# Patient Record
Sex: Female | Born: 1950 | Race: White | Hispanic: No | Marital: Married | Smoking: Former smoker
Health system: Southern US, Community
[De-identification: ages and names within clinical notes are randomized; demographics above are authoritative.]

## PROBLEM LIST (undated history)

## (undated) DIAGNOSIS — M8580 Other specified disorders of bone density and structure, unspecified site: Secondary | ICD-10-CM

## (undated) DIAGNOSIS — E079 Disorder of thyroid, unspecified: Secondary | ICD-10-CM

## (undated) DIAGNOSIS — K317 Polyp of stomach and duodenum: Secondary | ICD-10-CM

## (undated) DIAGNOSIS — K219 Gastro-esophageal reflux disease without esophagitis: Secondary | ICD-10-CM

## (undated) HISTORY — DX: Gastro-esophageal reflux disease without esophagitis: K21.9

## (undated) HISTORY — PX: BREAST CYST ASPIRATION: SHX578

## (undated) HISTORY — DX: Disorder of thyroid, unspecified: E07.9

## (undated) HISTORY — PX: ESOPHAGOGASTRODUODENOSCOPY: SHX1529

## (undated) HISTORY — PX: TUBAL LIGATION: SHX77

## (undated) HISTORY — PX: SHOULDER SURGERY: SHX246

## (undated) HISTORY — DX: Other specified disorders of bone density and structure, unspecified site: M85.80

## (undated) HISTORY — PX: BREAST SURGERY: SHX581

## (undated) HISTORY — DX: Polyp of stomach and duodenum: K31.7

## (undated) HISTORY — PX: BREAST EXCISIONAL BIOPSY: SUR124

---

## 1998-04-01 ENCOUNTER — Other Ambulatory Visit: Admission: RE | Admit: 1998-04-01 | Discharge: 1998-04-01 | Payer: Self-pay | Admitting: Obstetrics and Gynecology

## 1999-03-20 ENCOUNTER — Other Ambulatory Visit: Admission: RE | Admit: 1999-03-20 | Discharge: 1999-03-20 | Payer: Self-pay | Admitting: Obstetrics and Gynecology

## 2000-04-08 ENCOUNTER — Encounter: Admission: RE | Admit: 2000-04-08 | Discharge: 2000-04-08 | Payer: Self-pay | Admitting: Surgery

## 2000-04-08 ENCOUNTER — Encounter: Payer: Self-pay | Admitting: Surgery

## 2000-04-29 ENCOUNTER — Other Ambulatory Visit: Admission: RE | Admit: 2000-04-29 | Discharge: 2000-04-29 | Payer: Self-pay | Admitting: Obstetrics and Gynecology

## 2001-04-10 ENCOUNTER — Encounter: Admission: RE | Admit: 2001-04-10 | Discharge: 2001-04-10 | Payer: Self-pay | Admitting: Surgery

## 2001-04-10 ENCOUNTER — Encounter: Payer: Self-pay | Admitting: Surgery

## 2001-05-16 ENCOUNTER — Other Ambulatory Visit: Admission: RE | Admit: 2001-05-16 | Discharge: 2001-05-16 | Payer: Self-pay | Admitting: Obstetrics and Gynecology

## 2002-04-13 ENCOUNTER — Encounter: Admission: RE | Admit: 2002-04-13 | Discharge: 2002-04-13 | Payer: Self-pay | Admitting: Surgery

## 2002-04-13 ENCOUNTER — Encounter: Payer: Self-pay | Admitting: Surgery

## 2002-05-18 ENCOUNTER — Other Ambulatory Visit: Admission: RE | Admit: 2002-05-18 | Discharge: 2002-05-18 | Payer: Self-pay | Admitting: Obstetrics and Gynecology

## 2003-04-20 ENCOUNTER — Encounter: Payer: Self-pay | Admitting: Surgery

## 2003-04-20 ENCOUNTER — Encounter: Admission: RE | Admit: 2003-04-20 | Discharge: 2003-04-20 | Payer: Self-pay | Admitting: Surgery

## 2003-05-20 ENCOUNTER — Other Ambulatory Visit: Admission: RE | Admit: 2003-05-20 | Discharge: 2003-05-20 | Payer: Self-pay | Admitting: Obstetrics and Gynecology

## 2004-04-20 ENCOUNTER — Encounter: Admission: RE | Admit: 2004-04-20 | Discharge: 2004-04-20 | Payer: Self-pay | Admitting: Obstetrics and Gynecology

## 2004-05-23 ENCOUNTER — Ambulatory Visit (HOSPITAL_COMMUNITY): Admission: RE | Admit: 2004-05-23 | Discharge: 2004-05-23 | Payer: Self-pay | Admitting: Gastroenterology

## 2004-05-29 ENCOUNTER — Other Ambulatory Visit: Admission: RE | Admit: 2004-05-29 | Discharge: 2004-05-29 | Payer: Self-pay | Admitting: Obstetrics and Gynecology

## 2005-04-23 ENCOUNTER — Encounter: Admission: RE | Admit: 2005-04-23 | Discharge: 2005-04-23 | Payer: Self-pay | Admitting: Surgery

## 2005-06-06 ENCOUNTER — Other Ambulatory Visit: Admission: RE | Admit: 2005-06-06 | Discharge: 2005-06-06 | Payer: Self-pay | Admitting: *Deleted

## 2006-04-24 ENCOUNTER — Encounter: Admission: RE | Admit: 2006-04-24 | Discharge: 2006-04-24 | Payer: Self-pay | Admitting: Surgery

## 2006-06-10 ENCOUNTER — Other Ambulatory Visit: Admission: RE | Admit: 2006-06-10 | Discharge: 2006-06-10 | Payer: Self-pay | Admitting: Obstetrics & Gynecology

## 2007-04-28 ENCOUNTER — Encounter: Admission: RE | Admit: 2007-04-28 | Discharge: 2007-04-28 | Payer: Self-pay | Admitting: Surgery

## 2007-05-13 ENCOUNTER — Encounter: Admission: RE | Admit: 2007-05-13 | Discharge: 2007-05-13 | Payer: Self-pay | Admitting: Orthopedic Surgery

## 2007-06-02 ENCOUNTER — Ambulatory Visit (HOSPITAL_BASED_OUTPATIENT_CLINIC_OR_DEPARTMENT_OTHER): Admission: RE | Admit: 2007-06-02 | Discharge: 2007-06-02 | Payer: Self-pay | Admitting: Orthopedic Surgery

## 2007-06-13 ENCOUNTER — Other Ambulatory Visit: Admission: RE | Admit: 2007-06-13 | Discharge: 2007-06-13 | Payer: Self-pay | Admitting: Obstetrics & Gynecology

## 2007-06-27 ENCOUNTER — Encounter: Admission: RE | Admit: 2007-06-27 | Discharge: 2007-06-27 | Payer: Self-pay | Admitting: Obstetrics & Gynecology

## 2007-09-17 ENCOUNTER — Ambulatory Visit (HOSPITAL_COMMUNITY): Admission: RE | Admit: 2007-09-17 | Discharge: 2007-09-17 | Payer: Self-pay | Admitting: Gastroenterology

## 2007-09-17 ENCOUNTER — Encounter (INDEPENDENT_AMBULATORY_CARE_PROVIDER_SITE_OTHER): Payer: Self-pay | Admitting: Gastroenterology

## 2008-04-28 ENCOUNTER — Encounter: Admission: RE | Admit: 2008-04-28 | Discharge: 2008-04-28 | Payer: Self-pay | Admitting: Surgery

## 2008-06-25 ENCOUNTER — Other Ambulatory Visit: Admission: RE | Admit: 2008-06-25 | Discharge: 2008-06-25 | Payer: Self-pay | Admitting: Obstetrics & Gynecology

## 2009-05-02 ENCOUNTER — Encounter: Admission: RE | Admit: 2009-05-02 | Discharge: 2009-05-02 | Payer: Self-pay | Admitting: Surgery

## 2010-05-16 ENCOUNTER — Encounter: Admission: RE | Admit: 2010-05-16 | Discharge: 2010-05-16 | Payer: Self-pay | Admitting: Surgery

## 2011-02-27 NOTE — Op Note (Signed)
Ashlee Turner, Ashlee Turner                ACCOUNT NO.:  [id number]   MEDICAL RECORD NO.:  [id number]          PATIENT TYPE:  AMB   LOCATION:  DSC                          FACILITY:  [facility]   PHYSICIAN:  Robert A. Thurston Hole, M.D. DATE OF BIRTH:  [birth year]   DATE OF PROCEDURE:  06/02/2007  DATE OF DISCHARGE:                               OPERATIVE REPORT   PREOPERATIVE DIAGNOSES:  1. Left shoulder partial rotator cuff tear.  2. Left shoulder partial labrum tear.  3. Left shoulder adhesive capsulitis.  4. Left shoulder impingement.  5. 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   POSTOPERATIVE DIAGNOSES:  1. Left shoulder partial rotator cuff tear.  2. Left shoulder partial labrum tear.  3. Left shoulder adhesive capsulitis.  4. Left shoulder impingement.  5. 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   PROCEDURES:  1. Left shoulder exam under anesthesia followed by manipulation under      anesthesia.  2. Left shoulder arthroscopic debridement, partial rotator cuff tear      and partial labrum tear.  3. Left shoulder lysis of adhesions.  4. Left shoulder subacromial decompression.  5. Left shoulder distal clavicle excision.   SURGEON:  Elana Alm. Thurston Hole, M.D.   ASSISTANT:  Julien Girt, P.A.   ANESTHESIA:  General.   OPERATIVE TIME:  45 minutes.   COMPLICATIONS:  None.   INDICATIONS FOR PROCEDURE:  Ashlee Turner is a 60 year old woman who has  had significant left shoulder pain for over a year with exam and MRI  documenting rotator cuff tendinitis, possible partial tear with  impingement and AC joint DJD, spurring and adhesive capsulitis.  She has  failed conservative care and is now to undergo arthroscopy, manipulation  and debridement.   DESCRIPTION OF PROCEDURE:  Ashlee Turner was brought to the operating room  on June 02, 2007 after an interscalene block was placed in the holding  area by anesthesia.  She was placed on the  operative table in supine  position.  After being placed under general anesthesia her left shoulder  was examined.  Initial range of motion showed forward flexion to 140,  abduction of 140, internal and external rotation of 50 degrees.  A  gentle manipulation was carried out breaking up adhesions and improving  forward flexion to 175, abduction to 175 and internal and external  rotation of 85 degrees.  She was then placed in a beach-chair position  and her shoulder and arm were prepped and draped using sterile  technique.  Originally through a posterior arthroscopic portal the  arthroscope with a pump attachment was placed into an anterior portal  and arthroscopic probe was placed.  On initial inspection the articular  cartilage in the glenohumeral joint showed 25 to 30% grade III  chondromalacia on the humeral head which was debrided.  Glenoid  articular cartilage was intact.  She had partial tearing of the anterior  and anterior and superior labrum 25 to 30% which was debrided.  Biceps  tendon anchor and biceps tendon was intact.  Posterior labrum was  intact.  Anterior and  inferior labrum and anterior and inferior  glenohumeral ligament complex was intact.  Rotator cuff showed a partial  tear 30% of the subscapularis which was debrided.  The rest of the  rotator cuff was intact.  Inferior capsule recess showed moderately  erythematous synovium consistent with adhesive capsulitis which was  partially debrided and cauterized, otherwise this was free of pathology.  The subacromial space was entered and a lateral arthroscopic portal was  made.  A large amount of thickened bursitis was resected.  The rotator  cuff was inflamed and thickened on the bursal surface, but no evidence  of a tear.  Impingement was noted, and a subacromial decompression was  carried out removing 5 mm of the undersurface of the anterior,  anterolateral and anteromedial acromion, and CA ligament release carried  out  as well.  The [facility] joint showed significant spurring and degenerative  changes in the distal 5 to 6 mm of clavicle was resected with a 6 mm  burr.  After this was done the shoulder could be brought through a full  range of motion with no impingement on the rotator cuff.  At this point  it was felt that all pathology had been satisfactorily addressed.  The  instruments were removed.  The portals were closed with 3-0 nylon  suture.  The shoulder both intraarticular portion and subacromial space  was injected with 80 mg of Depo-Medrol and 10 mL of 0.25% Marcaine with  epinephrine.  Sterile dressings were applied.  The patient was then  awakened, extubated and taken to the recovery room in stable condition.   FOLLOWUP CARE:  Ashlee Turner will be followed as an outpatient on  Percocet, Robaxin and Mobic with early aggressive physical therapy.  We  will see her back in the office in a week for sutures out and followup.      Robert A. Thurston Hole, M.D.  Electronically Signed     RAW/MEDQ  D:  06/02/2007  T:  06/02/2007  Job:  [id number]

## 2011-02-27 NOTE — Op Note (Signed)
Ashlee Turner, Ashlee Turner                ACCOUNT NO.:  [id number]   MEDICAL RECORD NO.:  [id number]          PATIENT TYPE:  AMB   LOCATION:  ENDO                         FACILITY:  [facility]   PHYSICIAN:  Anselmo Rod, M.D.  DATE OF BIRTH:  01/07/1951   DATE OF PROCEDURE:  09/17/2007  DATE OF DISCHARGE:  09/17/2007                               OPERATIVE REPORT   PROCEDURE PERFORMED:  Colonoscopy with multiple cold biopsies.   ENDOSCOPIST:  Anselmo Rod.   INSTRUMENT USED:  Pentax video colonoscope.   INDICATIONS FOR PROCEDURE:  A 60 year old white female with a history of  change in bowel habits undergoing colonoscopy.  The patient had a  history of abdominal pain with loose stools; rule out colonic polyps,  masses, etc.   PREPROCEDURE PREPARATION:  Informed consent was procured from the  patient.  The patient fasted for 4 hours prior to the procedure and  prepped with 20 OsmoPrep pills the night of and 12 OsmoPrep pills the  morning of procedure.  Risks and benefits of the procedure including a  10% miss rate of cancer and polyps discussed with the patient as well.   PREPROCEDURE PHYSICAL:  Patient had stable vital signs.  NECK:  Supple.  CHEST:  Clear to auscultation.  S1/S2 regular.  ABDOMEN:  Soft with normal bowel sounds.   DESCRIPTION OF PROCEDURE:  The patient was placed in left lateral  decubitus position and sedated with 100 mcg of Fentanyl and 8 mg of  Versed given intravenously in slow incremental doses.  Once the patient  was adequately sedated and maintained on low-flow oxygen and continuous  cardiac monitoring, the Pentax video colonoscope was advanced from the  rectum to the cecum.  The appendiceal orifice and ileocecal valve were  clearly visualized and photographed.  There was some patchy loss of  vascular margins.  Biopsies were done to rule out collagenous versus  lymphocytic colitis.  No masses, polyps, erosions, ulcerations or  diverticula were seen.   Retroflexion revealed no abnormalities.  The  patient tolerated the procedure well without complications.   IMPRESSION:  1. Normal colonoscopy of the terminal ileum.  No masses, polyps,      erosions, ulcerations or diverticula noted.  2. Patchy loss of vascular margins, biopsies done to rule out      microscopic colitis.   RECOMMENDATIONS:  1. Await pathology results.  2. Avoid all nonsteroidals including aspirin for now.  3. Outpatient follow-up in the next 4 weeks for further      recommendations  4. Repeat colonoscopy in the next 10 years unless the patient has any      abnormal symptoms in the interim in which case she should contact      the office for further recommendations.      Anselmo Rod, M.D.  Electronically Signed     JNM/MEDQ  D:  09/23/2007  T:  09/24/2007  Job:  [id number]   cc:   M. Leda Quail, MD  Fax: [phone]

## 2011-03-02 NOTE — Op Note (Signed)
NAME:  Ashlee Turner, KROGH                          ACCOUNT NO.:  [id number]   MEDICAL RECORD NO.:  [id number]                   PATIENT TYPE:  AMB   LOCATION:  ENDO                                 FACILITY:  [facility]   PHYSICIAN:  James L. Malon Kindle., M.D.          DATE OF BIRTH:  April 03, 1951   DATE OF PROCEDURE:  05/23/2004  DATE OF DISCHARGE:                                 OPERATIVE REPORT   PROCEDURE PERFORMED:  Colonoscopy.   ENDOSCOPIST:  Llana Aliment. Edwards, M.D.   MEDICATIONS:  Fentanyl 100 mcg, Versed 10 mg IV.   INSTRUMENT USED:  Pediatric Olympus adjustable colonoscope.   INDICATIONS FOR PROCEDURE:  Strong family history of colon polyps.   DESCRIPTION OF PROCEDURE:  The procedure had been explained to the patient  and consent obtained.  With the patient in the left lateral decubitus  position, the Olympus colonoscope was inserted and advanced.  The prep was  excellent.  The patient had a long tortuous colon.  Multiple maneuvers  including position changes and abdominal pressure were required and  eventually we were able to reach the cecum.  The ileocecal valve and  appendiceal orifice were seen.  The colon was seen well upon removal.  The  prep was excellent.  The cecum, ascending colon, transverse colon,  descending and sigmoid colon were seen well upon removal and no polyps were  seen.  There was no significant diverticular disease. The scope was  withdrawn.  The patient tolerated the procedure well.   ASSESSMENT:  Family history of polyps with negative colonoscopy at this  time, V198.   PLAN:  Will recommend yearly Hemoccults and repeating a colonoscopy in five  years.                                               James L. Malon Kindle., M.D.    Waldron Session  D:  05/23/2004  T:  05/23/2004  Job:  [id number]   cc:   Marjory Lies, M.D.  [id number]  [place]  [place] [address]  Fax: [phone]

## 2011-04-17 ENCOUNTER — Other Ambulatory Visit (INDEPENDENT_AMBULATORY_CARE_PROVIDER_SITE_OTHER): Payer: Self-pay | Admitting: Surgery

## 2011-04-17 DIAGNOSIS — Z1231 Encounter for screening mammogram for malignant neoplasm of breast: Secondary | ICD-10-CM

## 2011-05-30 ENCOUNTER — Ambulatory Visit
Admission: RE | Admit: 2011-05-30 | Discharge: 2011-05-30 | Disposition: A | Discharge status: Home / Self Care | Payer: BC Managed Care – PPO | Source: Ambulatory Visit | Attending: Surgery | Admitting: Surgery

## 2011-05-30 DIAGNOSIS — Z1231 Encounter for screening mammogram for malignant neoplasm of breast: Secondary | ICD-10-CM

## 2011-07-27 LAB — POCT HEMOGLOBIN-HEMACUE: Hemoglobin: 13.6

## 2012-05-05 ENCOUNTER — Other Ambulatory Visit (INDEPENDENT_AMBULATORY_CARE_PROVIDER_SITE_OTHER): Payer: Self-pay | Admitting: Surgery

## 2012-05-05 DIAGNOSIS — Z1231 Encounter for screening mammogram for malignant neoplasm of breast: Secondary | ICD-10-CM

## 2012-06-02 ENCOUNTER — Inpatient Hospital Stay: Admission: RE | Admit: 2012-06-02 | Payer: BC Managed Care – PPO | Source: Ambulatory Visit

## 2012-06-04 ENCOUNTER — Ambulatory Visit: Payer: BC Managed Care – PPO

## 2012-06-05 ENCOUNTER — Ambulatory Visit
Admission: RE | Admit: 2012-06-05 | Discharge: 2012-06-05 | Disposition: A | Discharge status: Home / Self Care | Payer: BC Managed Care – PPO | Source: Ambulatory Visit | Attending: Surgery | Admitting: Surgery

## 2012-06-05 DIAGNOSIS — Z1231 Encounter for screening mammogram for malignant neoplasm of breast: Secondary | ICD-10-CM

## 2012-07-17 ENCOUNTER — Encounter (INDEPENDENT_AMBULATORY_CARE_PROVIDER_SITE_OTHER): Payer: Self-pay | Admitting: General Surgery

## 2012-07-17 DIAGNOSIS — N6019 Diffuse cystic mastopathy of unspecified breast: Secondary | ICD-10-CM | POA: Insufficient documentation

## 2012-07-18 ENCOUNTER — Encounter (INDEPENDENT_AMBULATORY_CARE_PROVIDER_SITE_OTHER): Payer: Self-pay | Admitting: Surgery

## 2012-07-18 ENCOUNTER — Ambulatory Visit (INDEPENDENT_AMBULATORY_CARE_PROVIDER_SITE_OTHER): Payer: BC Managed Care – PPO | Admitting: Surgery

## 2012-07-18 VITALS — BP 130/72 | HR 76 | Temp 97.2°F | Resp 18 | Ht 61.0 in | Wt 101.0 lb

## 2012-07-18 DIAGNOSIS — N6019 Diffuse cystic mastopathy of unspecified breast: Secondary | ICD-10-CM

## 2012-07-18 NOTE — Progress Notes (Signed)
Chief complaint: Rest followup for fibrocystic disease  History of present illness: This patient follows up annually because of fibrocystic changes in her breasts. She is not in any breast self exams. She had 2 left breast biopsies one in 1984 and one in 1994 both of which were benign showing fibrocystic changes. She has no new symptoms in her breasts.  Past history, family history, review of systems are all noted and not redictated this note  Exam: Vital signs:BP 130/72  Pulse 76  Temp 97.2 F (36.2 C)  Resp 18  Ht 5\' 1"  (1.549 m)  Wt 101 lb (45.813 kg)  BMI 19.08 kg/m2 Gen.: Patient alert oriented healthy-appearing Breasts: The breasts are symmetric. They're not tender. They're smooth and there is no irregularity. They are somewhat dense. No skin nipple or areolar changes are noted. Lymphatics: No axillary or supraclavicular adenopathy on each side  Data reviewed Mammogram: IMPRESSION:  No mammographic evidence of malignancy.  A result letter of this screening mammogram will be mailed directly  to the patient.  RECOMMENDATION:  Screening mammogram in one year. (Code:SM-B-01Y)  BI-RADS CATEGORY 1: Negative. Note this was a tomo mammogram  Impression: Stable exam with mild fiber cystic changes no suspicious areas  Plan: She needs to continue annual mammogram and I recommended continue with tomo-mammograms.she can follow up with her primary physicians or with [place] in a year if she wishes.

## 2012-07-18 NOTE — Patient Instructions (Signed)
Continue annual mammograms  

## 2013-05-14 ENCOUNTER — Other Ambulatory Visit: Payer: Self-pay

## 2013-05-14 DIAGNOSIS — Z1231 Encounter for screening mammogram for malignant neoplasm of breast: Secondary | ICD-10-CM

## 2013-06-11 ENCOUNTER — Ambulatory Visit
Admission: RE | Admit: 2013-06-11 | Discharge: 2013-06-11 | Disposition: A | Discharge status: Home / Self Care | Payer: BC Managed Care – PPO | Source: Ambulatory Visit

## 2013-06-11 DIAGNOSIS — Z1231 Encounter for screening mammogram for malignant neoplasm of breast: Secondary | ICD-10-CM

## 2013-07-07 ENCOUNTER — Encounter: Payer: Self-pay | Admitting: Obstetrics & Gynecology

## 2013-07-24 ENCOUNTER — Ambulatory Visit (INDEPENDENT_AMBULATORY_CARE_PROVIDER_SITE_OTHER): Payer: BC Managed Care – PPO | Admitting: Surgery

## 2013-07-24 ENCOUNTER — Encounter (INDEPENDENT_AMBULATORY_CARE_PROVIDER_SITE_OTHER): Payer: Self-pay | Admitting: Surgery

## 2013-07-24 VITALS — BP 110/72 | HR 68 | Temp 97.9°F | Resp 14 | Ht 61.0 in | Wt 102.4 lb

## 2013-07-24 DIAGNOSIS — N6019 Diffuse cystic mastopathy of unspecified breast: Secondary | ICD-10-CM

## 2013-07-24 NOTE — Progress Notes (Signed)
Chief complaint: Rest followup for fibrocystic disease  History of present illness: This patient follows up annually because of fibrocystic changes in her breasts. She does not do any breast self exams. She had 2 left breast biopsies one in 1984 and one in 1994 both of which were benign showing fibrocystic changes. She has no new symptoms in her breasts.  Past history, family history, review of systems are all noted and not redictated this note  Exam: Vital signs:BP 110/72  Pulse 68  Temp(Src) 97.9 F (36.6 C) (Temporal)  Resp 14  Ht 5\' 1"  (1.549 m)  Wt 102 lb 6.4 oz (46.448 kg)  BMI 19.36 kg/m2 Gen.: Patient alert oriented healthy-appearing Breasts: The breasts are symmetric. They're not tender. They're smooth and there is no irregularity. They are somewhat dense. No skin nipple or areolar changes are noted. Lymphatics: No axillary or supraclavicular adenopathy on each side  Data reviewed Mammogram 06/12/13 *RADIOLOGY REPORT*  Clinical Data: Screening.  DIGITAL SCREENING BILATERAL MAMMOGRAM WITH CAD  DIGITAL BREAST TOMOSYNTHESIS  Digital breast tomosynthesis images are acquired in two  projections. These images are reviewed in combination with the  digital mammogram, confirming the findings below.  Comparison: 04/24/2006  FINDINGS:  ACR Breast Density Category c: The breast tissue is  heterogeneously dense, which may obscure small masses.  There is no suspicious dominant mass, architectural distortion, or  calcification to suggest malignancy.  Images were processed with CAD.  IMPRESSION:  No mammographic evidence of malignancy.  A result letter of this screening mammogram will be mailed directly  to the patient.  RECOMMENDATION:  Screening mammogram in one year. (Code:SM-B-01Y)  BI-RADS CATEGORY 1: Negative.  Original Report Authenticated By: Cain Saupe, M.D.   Impression: Stable exam with mild fiber cystic changes no suspicious areas  Plan: She needs to continue  annual mammogram and I recommended continue with tomo-mammograms.she can follow up with her primary physicians or with [place] in a year if she wishes.

## 2013-07-24 NOTE — Patient Instructions (Signed)
Continue annual followups. See Dr Donell Beers next year since I will be retired

## 2013-09-09 ENCOUNTER — Encounter: Payer: Self-pay | Admitting: Obstetrics & Gynecology

## 2013-09-14 ENCOUNTER — Encounter: Payer: Self-pay | Admitting: Obstetrics & Gynecology

## 2013-09-14 ENCOUNTER — Ambulatory Visit (INDEPENDENT_AMBULATORY_CARE_PROVIDER_SITE_OTHER): Payer: BC Managed Care – PPO | Admitting: Obstetrics & Gynecology

## 2013-09-14 VITALS — BP 140/78 | HR 68 | Resp 16 | Ht 61.25 in | Wt 103.4 lb

## 2013-09-14 DIAGNOSIS — Z124 Encounter for screening for malignant neoplasm of cervix: Secondary | ICD-10-CM

## 2013-09-14 DIAGNOSIS — Z0000 Encounter for general adult medical examination without abnormal findings: Secondary | ICD-10-CM

## 2013-09-14 DIAGNOSIS — Z01419 Encounter for gynecological examination (general) (routine) without abnormal findings: Secondary | ICD-10-CM

## 2013-09-14 LAB — POCT URINALYSIS DIPSTICK
Leukocytes, UA: NEGATIVE
Nitrite, UA: NEGATIVE
Urobilinogen, UA: NEGATIVE

## 2013-09-14 NOTE — Patient Instructions (Signed)

## 2013-09-14 NOTE — Progress Notes (Signed)
62 y.o. G4P3 MarriedCaucasianF here for annual exam.  No vaginal bleeding.  Has a new baby granddaughter.  Family is doing really well.  Had finger injury in garage door while at the beach.  Last blood work was in April.  Tried Paxil last year for three months for anxiety.  Didn't really do anything for her.    Patient's last menstrual period was 10/15/2006.          Sexually active: yes  The current method of family planning is tubal ligation.    Exercising: yes  gym and walking/jogging Smoker:  no  Health Maintenance: Pap:  08/29/11 WNL History of abnormal Pap:  no MMG:  06/11/13 normal Colonoscopy:  2008 repeat in 10 years (Dr. Loreta Ave) BMD:   9/08, declines TDaP:  2014 Screening Labs: PCP, Hb today: PCP, Urine today: negative   reports that she quit smoking about 38 years ago. She has never used smokeless tobacco. She reports that she drinks alcohol. She reports that she does not use illicit drugs.  Past Medical History  Diagnosis Date  . Thyroid disease     hypothyroidism  . GERD (gastroesophageal reflux disease)   . Osteopenia   . Gastric polyp     Past Surgical History  Procedure Laterality Date  . Breast surgery      breast biopsy x2  . Shoulder surgery      left shoulder  . Esophagogastroduodenoscopy    . Tubal ligation      Current Outpatient Prescriptions  Medication Sig Dispense Refill  . cholecalciferol (VITAMIN D) 1000 UNITS tablet Take 1,000 Units by mouth daily.      Marland Kitchen ELDERBERRY PO Take by mouth.      . levothyroxine (SYNTHROID, LEVOTHROID) 75 MCG tablet Take 75 mcg by mouth daily.      Marland Kitchen MAGNESIUM PO Take by mouth.      . NON FORMULARY Natural raw food green supplement in smoothie every morning      . omega-3 acid ethyl esters (LOVAZA) 1 G capsule Take by mouth daily.      Maxwell Caul Bicarbonate (ZEGERID PO) Take 20 mg by mouth daily.      . Probiotic Product (PROBIOTIC DAILY PO) Take by mouth daily.       No current facility-administered  medications for this visit.    Family History  Problem Relation Age of Onset  . Thyroid disease Mother   . Thyroid disease Maternal Grandmother   . Breast cancer Maternal Aunt     ROS:  Pertinent items are noted in HPI.  Otherwise, a comprehensive ROS was negative.  Exam:   BP 140/78  Pulse 68  Resp 16  Ht 5' 1.25" (1.556 m)  Wt 103 lb 6.4 oz (46.902 kg)  BMI 19.37 kg/m2  LMP 10/15/2006  Weight change: _1lb  Height: 5' 1.25" (155.6 cm)  Ht Readings from Last 3 Encounters:  09/14/13 5' 1.25" (1.556 m)  07/24/13 5\' 1"  (1.549 m)  07/18/12 5\' 1"  (1.549 m)    General appearance: alert, cooperative and appears stated age Head: Normocephalic, without obvious abnormality, atraumatic Neck: no adenopathy, supple, symmetrical, trachea midline and thyroid normal to inspection and palpation Lungs: clear to auscultation bilaterally Breasts: normal appearance, no masses or tenderness Heart: regular rate and rhythm Abdomen: soft, non-tender; bowel sounds normal; no masses,  no organomegaly Extremities: extremities normal, atraumatic, no cyanosis or edema Skin: Skin color, texture, turgor normal. No rashes or lesions Lymph nodes: Cervical, supraclavicular, and axillary nodes normal.  No abnormal inguinal nodes palpated Neurologic: Grossly normal   Pelvic: External genitalia:  no lesions              Urethra:  normal appearing urethra with no masses, tenderness or lesions              Bartholins and Skenes: normal                 Vagina: normal appearing vagina with normal color and discharge, no lesions              Cervix: no lesions              Pap taken: yes Bimanual Exam:  Uterus:  normal size, contour, position, consistency, mobility, non-tender              Adnexa: normal adnexa and no mass, fullness, tenderness               Rectovaginal: Confirms               Anus:  normal sphincter tone, no lesions  A:  Well Woman with normal exam PMP no HRT Hypothyroidism H/O IBS  P:    Mammogram yearly pap smear with HR HPV today Labs with Dr. Doristine Counter.  Goes once yearly. return annually or prn  An After Visit Summary was printed and given to the patient.

## 2013-09-18 LAB — IPS PAP TEST WITH HPV

## 2013-12-17 ENCOUNTER — Ambulatory Visit: Payer: Self-pay | Admitting: Obstetrics & Gynecology

## 2014-05-18 ENCOUNTER — Other Ambulatory Visit: Payer: Self-pay

## 2014-05-18 DIAGNOSIS — Z1231 Encounter for screening mammogram for malignant neoplasm of breast: Secondary | ICD-10-CM

## 2014-06-14 ENCOUNTER — Encounter (INDEPENDENT_AMBULATORY_CARE_PROVIDER_SITE_OTHER): Payer: Self-pay

## 2014-06-14 ENCOUNTER — Ambulatory Visit
Admission: RE | Admit: 2014-06-14 | Discharge: 2014-06-14 | Disposition: A | Discharge status: Home / Self Care | Payer: BC Managed Care – PPO | Source: Ambulatory Visit

## 2014-06-14 DIAGNOSIS — Z1231 Encounter for screening mammogram for malignant neoplasm of breast: Secondary | ICD-10-CM

## 2014-08-16 ENCOUNTER — Encounter: Payer: Self-pay | Admitting: Obstetrics & Gynecology

## 2014-10-01 ENCOUNTER — Encounter: Payer: Self-pay | Admitting: Obstetrics & Gynecology

## 2014-10-01 ENCOUNTER — Ambulatory Visit (INDEPENDENT_AMBULATORY_CARE_PROVIDER_SITE_OTHER): Payer: BC Managed Care – PPO | Admitting: Obstetrics & Gynecology

## 2014-10-01 VITALS — BP 132/80 | HR 68 | Resp 16 | Ht 61.5 in | Wt 103.4 lb

## 2014-10-01 DIAGNOSIS — Z01419 Encounter for gynecological examination (general) (routine) without abnormal findings: Secondary | ICD-10-CM

## 2014-10-01 DIAGNOSIS — Z0000 Encounter for general adult medical examination without abnormal findings: Secondary | ICD-10-CM

## 2014-10-01 LAB — POCT URINALYSIS DIPSTICK
Bilirubin, UA: NEGATIVE
Blood, UA: NEGATIVE
GLUCOSE UA: NEGATIVE
Ketones, UA: NEGATIVE
Leukocytes, UA: NEGATIVE
NITRITE UA: NEGATIVE
PROTEIN UA: NEGATIVE
UROBILINOGEN UA: NEGATIVE
pH, UA: 7

## 2014-10-01 NOTE — Progress Notes (Signed)
63 y.o. G4P3 MarriedCaucasianF here for annual exam.  No vaginal bleeding.  Continues to have GI issues.    Saw Dr. Donell BeersByerly in October for breast exam.  Used to see Dr. Jamey RipaStreck until he retired.    PCP:  Dr. Rosezetta SchlatterBurnette.  Saw in April  Patient's last menstrual period was 10/15/2006.          Sexually active: Yes.    The current method of family planning is tubal ligation.    Exercising: Yes.    gym ,trainer, walking, and jogging Smoker:  Former smoker-years ago  Health Maintenance: Pap:  09/14/13 WNL/negative HR HPV History of abnormal Pap:  no MMG:  06/14/14 3D-normal Colonoscopy:  2008-repeat in 10 years Dr Loreta AveMann BMD:   9/08, declines treatment TDaP:  2014 Screening Labs: PCP, Hb today: PCP, Urine today: PH-7.0   reports that she quit smoking about 39 years ago. She has never used smokeless tobacco. She reports that she drinks alcohol. She reports that she does not use illicit drugs.  Past Medical History  Diagnosis Date  . Thyroid disease     hypothyroidism  . GERD (gastroesophageal reflux disease)   . Osteopenia   . Gastric polyp     Past Surgical History  Procedure Laterality Date  . Breast surgery      breast biopsy x2  . Shoulder surgery      left shoulder  . Esophagogastroduodenoscopy    . Tubal ligation      Current Outpatient Prescriptions  Medication Sig Dispense Refill  . cholecalciferol (VITAMIN D) 1000 UNITS tablet Take 1,000 Units by mouth daily.    Marland Kitchen. ELDERBERRY PO Take by mouth. Immune system builder    . levothyroxine (SYNTHROID, LEVOTHROID) 75 MCG tablet Take 75 mcg by mouth daily.    Marland Kitchen. MAGNESIUM PO Take by mouth.    . NON FORMULARY Natural raw food green supplement in smoothie every morning    . Omeprazole-Sodium Bicarbonate (ZEGERID PO) Take 20 mg by mouth as needed.     . Probiotic Product (PROBIOTIC DAILY PO) Take by mouth daily.     No current facility-administered medications for this visit.    Family History  Problem Relation Age of Onset  .  Thyroid disease Mother   . Thyroid disease Maternal Grandmother   . Breast cancer Maternal Aunt     ROS:  Pertinent items are noted in HPI.  Otherwise, a comprehensive ROS was negative.  Exam:   BP 132/80 mmHg  Pulse 68  Resp 16  Ht 5' 1.5" (1.562 m)  Wt 103 lb 6.4 oz (46.902 kg)  BMI 19.22 kg/m2  LMP 10/15/2006     Height: 5' 1.5" (156.2 cm)  Ht Readings from Last 3 Encounters:  10/01/14 5' 1.5" (1.562 m)  09/14/13 5' 1.25" (1.556 m)  07/24/13 5\' 1"  (1.549 m)    General appearance: alert, cooperative and appears stated age Head: Normocephalic, without obvious abnormality, atraumatic Neck: no adenopathy, supple, symmetrical, trachea midline and thyroid normal to inspection and palpation Lungs: clear to auscultation bilaterally Breasts: normal appearance, no masses or tenderness Heart: regular rate and rhythm Abdomen: soft, non-tender; bowel sounds normal; no masses,  no organomegaly Extremities: extremities normal, atraumatic, no cyanosis or edema Skin: Skin color, texture, turgor normal. No rashes or lesions Lymph nodes: Cervical, supraclavicular, and axillary nodes normal. No abnormal inguinal nodes palpated Neurologic: Grossly normal   Pelvic: External genitalia:  no lesions  Urethra:  normal appearing urethra with no masses, tenderness or lesions              Bartholins and Skenes: normal                 Vagina: normal appearing vagina with normal color and discharge, no lesions              Cervix: no lesions              Pap taken: No. Bimanual Exam:  Uterus:  normal size, contour, position, consistency, mobility, non-tender              Adnexa: normal adnexa and no mass, fullness, tenderness               Rectovaginal: Confirms               Anus:  normal sphincter tone, no lesions  Chaperone was present for exam.  A:  Well Woman with normal exam PMP no HRT Hypothyroidism, on Synthroid. H/O IBS Fibrocystic breast disease Remote hx of  hydrosalpinx on ultrasound that resolved with follow up.  P: Mammogram yearly.  Doing 3D yearly.  Seeing Dr. Donell BeersByerly yearly as well.   pap smear with HR HPV 2014.  No pap today.   Labs with Dr. Doristine CounterBurnett. Goes once yearly. return annually or prn

## 2015-05-19 ENCOUNTER — Other Ambulatory Visit: Payer: Self-pay

## 2015-05-19 DIAGNOSIS — Z1231 Encounter for screening mammogram for malignant neoplasm of breast: Secondary | ICD-10-CM

## 2015-06-24 ENCOUNTER — Ambulatory Visit
Admission: RE | Admit: 2015-06-24 | Discharge: 2015-06-24 | Disposition: A | Discharge status: Home / Self Care | Payer: BC Managed Care – PPO | Source: Ambulatory Visit

## 2015-06-24 DIAGNOSIS — Z1231 Encounter for screening mammogram for malignant neoplasm of breast: Secondary | ICD-10-CM

## 2015-10-18 ENCOUNTER — Telehealth: Payer: Self-pay | Admitting: Obstetrics & Gynecology

## 2015-10-18 DIAGNOSIS — Z0000 Encounter for general adult medical examination without abnormal findings: Secondary | ICD-10-CM

## 2015-10-18 NOTE — Telephone Encounter (Signed)
Patient wants to have blood work done when she has her annual in February. Will she need an order? She is asking the nurse to call her to let her know this can be done.

## 2015-10-18 NOTE — Telephone Encounter (Signed)
Spoke with patient. Patient states that she had alb work done with her PCP this year, but did not have her cholesterol checked. Would like to have this checked at her aex with Dr.Miller on 12/01/2015 at 9 am. Advise this can be checked at this appointment. This will need to be done fasting. Needs to be fasting for 9-12 hours prior to appointment. Patient is agreeable. Aware if she would like any additional lab work will need to speak with Dr.Miller about this at her appointment so labs can be added if needed. Future order for lipid panel placed.  Routing to provider for final review. Patient agreeable to disposition. Will close encounter.

## 2015-12-01 ENCOUNTER — Ambulatory Visit (INDEPENDENT_AMBULATORY_CARE_PROVIDER_SITE_OTHER): Payer: BC Managed Care – PPO | Admitting: Obstetrics & Gynecology

## 2015-12-01 ENCOUNTER — Encounter: Payer: Self-pay | Admitting: Obstetrics & Gynecology

## 2015-12-01 VITALS — BP 136/72 | HR 72 | Resp 16 | Ht 61.25 in | Wt 100.0 lb

## 2015-12-01 DIAGNOSIS — Z124 Encounter for screening for malignant neoplasm of cervix: Secondary | ICD-10-CM | POA: Diagnosis not present

## 2015-12-01 DIAGNOSIS — Z0000 Encounter for general adult medical examination without abnormal findings: Secondary | ICD-10-CM | POA: Diagnosis not present

## 2015-12-01 DIAGNOSIS — Z01419 Encounter for gynecological examination (general) (routine) without abnormal findings: Secondary | ICD-10-CM | POA: Diagnosis not present

## 2015-12-01 DIAGNOSIS — Z205 Contact with and (suspected) exposure to viral hepatitis: Secondary | ICD-10-CM | POA: Diagnosis not present

## 2015-12-01 LAB — POCT URINALYSIS DIPSTICK
BILIRUBIN UA: NEGATIVE
Blood, UA: NEGATIVE
GLUCOSE UA: NEGATIVE
KETONES UA: NEGATIVE
LEUKOCYTES UA: NEGATIVE
Nitrite, UA: NEGATIVE
PH UA: 7
Protein, UA: NEGATIVE
Urobilinogen, UA: NEGATIVE

## 2015-12-01 LAB — LIPID PANEL
CHOL/HDL RATIO: 2.9 ratio (ref <=5.0)
Cholesterol: 184 mg/dL (ref 125–200)
HDL: 64 mg/dL (ref >=46)
LDL CALC: 110 mg/dL (ref <130)
TRIGLYCERIDES: 52 mg/dL (ref <150)
VLDL: 10 mg/dL (ref <30)

## 2015-12-01 NOTE — Progress Notes (Signed)
65 y.o. G4P3 MarriedCaucasianF here for annual exam.  Doing well.  Denies vaginal bleeding.  Has not had Hep C testing done.  PCP:  Dr. Doristine Counter  Patient's last menstrual period was 10/15/2006.          Sexually active: No. The current method of family planning is abstinence and post menopausal status.    Exercising: Yes.    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 HR HPV:neg  History of abnormal Pap:  no MMG:  06/24/15 BIRADS1:Neg Colonoscopy:  2008 repeat 10 years  BMD:  07/02/2007 Osteopenia  TDaP:  03/2013  Zostavax:  done Screening Labs: PCP, Hb today: PCP, Urine today: Negative   reports that she quit smoking about 40 years ago. She has never used smokeless tobacco. She reports that she drinks alcohol. She reports that she does not use illicit drugs.  Past Medical History  Diagnosis Date  . Thyroid disease     hypothyroidism  . GERD (gastroesophageal reflux disease)   . Osteopenia   . Gastric polyp     Past Surgical History  Procedure Laterality Date  . Breast surgery      breast biopsy x2  . Shoulder surgery      left shoulder  . Esophagogastroduodenoscopy    . Tubal ligation      Current Outpatient Prescriptions  Medication Sig Dispense Refill  . b complex vitamins capsule Take 1 capsule by mouth daily.    . cholecalciferol (VITAMIN D) 1000 UNITS tablet Take 1,000 Units by mouth daily.    Marland Kitchen ELDERBERRY PO Take by mouth. Immune system builder    . levothyroxine (SYNTHROID, LEVOTHROID) 75 MCG tablet Take 75 mcg by mouth daily.    . NON FORMULARY Natural raw food green supplement in smoothie every morning    . Omeprazole-Sodium Bicarbonate (ZEGERID PO) Take 20 mg by mouth as needed.     . Probiotic Product (PROBIOTIC DAILY PO) Take by mouth daily.     No current facility-administered medications for this visit.    Family History  Problem Relation Age of Onset  . Thyroid disease Mother   . Thyroid disease Maternal  Grandmother   . Breast cancer Maternal Aunt     ROS:  Pertinent items are noted in HPI.  Otherwise, a comprehensive ROS was negative.  Exam:   BP 136/72 mmHg  Pulse 72  Resp 16  Ht 5' 1.25" (1.556 m)  Wt 100 lb (45.36 kg)  BMI 18.74 kg/m2  LMP 10/15/2006  Weight change: +3#  Height: 5' 1.25" (155.6 cm)  Ht Readings from Last 3 Encounters:  12/01/15 5' 1.25" (1.556 m)  10/01/14 5' 1.5" (1.562 m)  09/14/13 5' 1.25" (1.556 m)    General appearance: alert, cooperative and appears stated age Head: Normocephalic, without obvious abnormality, atraumatic Neck: no adenopathy, supple, symmetrical, trachea midline and thyroid normal to inspection and palpation Lungs: clear to auscultation bilaterally Breasts: normal appearance, no masses or tenderness Heart: regular rate and rhythm Abdomen: soft, non-tender; bowel sounds normal; no masses,  no organomegaly Extremities: extremities normal, atraumatic, no cyanosis or edema Skin: Skin color, texture, turgor normal. No rashes or lesions Lymph nodes: Cervical, supraclavicular, and axillary nodes normal. No abnormal inguinal nodes palpated Neurologic: Grossly normal  Pelvic: External genitalia:  no lesions              Urethra:  normal appearing urethra with no masses, tenderness or lesions  Bartholins and Skenes: normal                 Vagina: normal appearing vagina with normal color and discharge, no lesions              Cervix: no lesions              Pap taken: Yes.   Bimanual Exam:  Uterus:  normal size, contour, position, consistency, mobility, non-tender              Adnexa: normal adnexa and no mass, fullness, tenderness               Rectovaginal: Confirms               Anus:  normal sphincter tone, no lesions  Chaperone was present for exam.  A:  Well Woman with normal exam PMP no HRT Hypothyroidism, on Synthroid H/O IBS Fibrocystic breast disease Remote hx of hydrosalpinx on ultrasound that resolved with  follow up.  No further additional ultrasounds done.  P: Mammogram yearly. Doing 3D yearly. Seeing Dr. Donell Beers yearly as well.  pap smear with HR HPV 2014. Pap with HR HPV. Hep C antibody Lipids Labs and vaccines with Dr. Rosezetta Schlatter return annually or prn

## 2015-12-02 LAB — HEPATITIS C ANTIBODY: HCV AB: NEGATIVE

## 2015-12-05 LAB — IPS PAP TEST WITH HPV

## 2016-05-30 ENCOUNTER — Other Ambulatory Visit: Payer: Self-pay | Admitting: General Surgery

## 2016-05-30 DIAGNOSIS — Z1231 Encounter for screening mammogram for malignant neoplasm of breast: Secondary | ICD-10-CM

## 2016-06-25 ENCOUNTER — Ambulatory Visit
Admission: RE | Admit: 2016-06-25 | Discharge: 2016-06-25 | Disposition: A | Discharge status: Home / Self Care | Payer: BC Managed Care – PPO | Source: Ambulatory Visit | Attending: General Surgery | Admitting: General Surgery

## 2016-06-25 DIAGNOSIS — Z1231 Encounter for screening mammogram for malignant neoplasm of breast: Secondary | ICD-10-CM

## 2016-12-06 NOTE — Progress Notes (Signed)
66 y.o. [id number] Married Caucasian F here for annual exam.  Doing well.  No vaginal bleeding.  Really not having GI issues at all at this time.  Careful with diet.  Exercising regularly.  Retired in June.  So happy about this.  PCP:  Dr. Andrey CampanileWilson.  Lab work done in April.    Patient's last menstrual period was 10/15/2006.          Sexually active: Yes.    The current method of family planning is tubal ligation.    Exercising: Yes.    Walking, trainer, strength training Smoker:  Former smoker   Health Maintenance: Pap: 12/01/15 negative, HR HPV negative, 09/14/13 negative, HR HPV negative  History of abnormal Pap:  no MMG:  06/25/16 BIRADS 1 negative  Colonoscopy:  2008- repeat 10 years  BMD:   07/02/07 osteopenia  TDaP:  03/15/13 Pneumonia vaccine(s):  never Zostavax:   08/13/12  Hep C testing: 12/01/15 negative Screening Labs: PCP, Hb today: PCP   reports that she quit smoking about 41 years ago. She has never used smokeless tobacco. She reports that she drinks alcohol. She reports that she does not use drugs.  Past Medical History:  Diagnosis Date  . Gastric polyp   . GERD (gastroesophageal reflux disease)   . Osteopenia   . Thyroid disease    hypothyroidism    Past Surgical History:  Procedure Laterality Date  . BREAST SURGERY     breast biopsy x2  . ESOPHAGOGASTRODUODENOSCOPY    . SHOULDER SURGERY     left shoulder  . TUBAL LIGATION      Current Outpatient Prescriptions  Medication Sig Dispense Refill  . b complex vitamins capsule Take 1 capsule by mouth daily.    . cholecalciferol (VITAMIN D) 1000 UNITS tablet Take 1,000 Units by mouth daily.    Marland Kitchen. ELDERBERRY PO Take by mouth. Immune system builder    . famotidine (PEPCID) 20 MG tablet Take 20 mg by mouth.    . levothyroxine (SYNTHROID, LEVOTHROID) 75 MCG tablet Take 75 mcg by mouth daily.    . NON FORMULARY Natural raw food green supplement in smoothie every morning    . Omeprazole-Sodium Bicarbonate (ZEGERID PO) Take  20 mg by mouth as needed.     . Probiotic Product (PROBIOTIC DAILY PO) Take by mouth daily.     No current facility-administered medications for this visit.     Family History  Problem Relation Age of Onset  . Thyroid disease Mother   . Thyroid disease Maternal Grandmother   . Breast cancer Maternal Aunt     ROS:  Pertinent items are noted in HPI.  Otherwise, a comprehensive ROS was negative.  Exam:   BP 134/74 (BP Location: Right Arm, Patient Position: Sitting, Cuff Size: Normal)   Pulse 72   Resp 14   Ht 5\' 1"  (1.549 m)   Wt 103 lb 12.8 oz (47.1 kg)   LMP 10/15/2006   BMI 19.61 kg/m   Weight change: +3#    Height: 5\' 1"  (154.9 cm)  Ht Readings from Last 3 Encounters:  12/07/16 5\' 1"  (1.549 m)  12/01/15 5' 1.25" (1.556 m)  10/01/14 5' 1.5" (1.562 m)    General appearance: alert, cooperative and appears stated age Head: Normocephalic, without obvious abnormality, atraumatic Neck: no adenopathy, supple, symmetrical, trachea midline and thyroid normal to inspection and palpation Lungs: clear to auscultation bilaterally Breasts: normal appearance, no masses or tenderness Heart: regular rate and rhythm Abdomen: soft, non-tender; bowel sounds  normal; no masses,  no organomegaly Extremities: extremities normal, atraumatic, no cyanosis or edema Skin: Skin color, texture, turgor normal. No rashes or lesions Lymph nodes: Cervical, supraclavicular, and axillary nodes normal. No abnormal inguinal nodes palpated Neurologic: Grossly normal   Pelvic: External genitalia:  no lesions              Urethra:  normal appearing urethra with no masses, tenderness or lesions              Bartholins and Skenes: normal                 Vagina: normal appearing vagina with normal color and discharge, no lesions              Cervix: no lesions              Pap taken: No. Bimanual Exam:  Uterus:  normal size, contour, position, consistency, mobility, non-tender              Adnexa: normal  adnexa and no mass, fullness, tenderness               Rectovaginal: Confirms               Anus:  normal sphincter tone, no lesions  Chaperone was present for exam.  A:  Well Woman with normal exam PMP, no RHT Hypothyroidism H/O IBS Fibrocystic breast disease Remote hx of hydrosalpinx on ultrasound with resolution in follow up  P:   Mammogram guidelines reveiwed. pap smear and HR HPV neg 2017.  Not indicated this year. Lab work with Dr. Andrey Campanile Return annually or prn

## 2016-12-07 ENCOUNTER — Encounter: Payer: Self-pay | Admitting: Obstetrics & Gynecology

## 2016-12-07 ENCOUNTER — Ambulatory Visit (INDEPENDENT_AMBULATORY_CARE_PROVIDER_SITE_OTHER): Payer: BC Managed Care – PPO | Admitting: Obstetrics & Gynecology

## 2016-12-07 VITALS — BP 134/74 | HR 72 | Resp 14 | Ht 61.0 in | Wt 103.8 lb

## 2016-12-07 DIAGNOSIS — Z01419 Encounter for gynecological examination (general) (routine) without abnormal findings: Secondary | ICD-10-CM | POA: Diagnosis not present

## 2017-01-30 DIAGNOSIS — M8589 Other specified disorders of bone density and structure, multiple sites: Secondary | ICD-10-CM | POA: Insufficient documentation

## 2017-03-21 ENCOUNTER — Ambulatory Visit: Payer: BC Managed Care – PPO | Admitting: Obstetrics & Gynecology

## 2017-05-27 ENCOUNTER — Other Ambulatory Visit: Payer: Self-pay | Admitting: Physical Medicine and Rehabilitation

## 2017-05-27 DIAGNOSIS — Z1231 Encounter for screening mammogram for malignant neoplasm of breast: Secondary | ICD-10-CM

## 2017-06-26 ENCOUNTER — Ambulatory Visit: Payer: BC Managed Care – PPO

## 2017-07-02 ENCOUNTER — Ambulatory Visit
Admission: RE | Admit: 2017-07-02 | Discharge: 2017-07-02 | Disposition: A | Discharge status: Home / Self Care | Payer: Medicare Other | Source: Ambulatory Visit | Attending: Physical Medicine and Rehabilitation | Admitting: Physical Medicine and Rehabilitation

## 2017-07-02 DIAGNOSIS — Z1231 Encounter for screening mammogram for malignant neoplasm of breast: Secondary | ICD-10-CM

## 2018-01-20 ENCOUNTER — Encounter: Payer: Self-pay | Admitting: Obstetrics & Gynecology

## 2018-03-14 ENCOUNTER — Encounter: Payer: Self-pay | Admitting: Obstetrics & Gynecology

## 2018-03-14 ENCOUNTER — Other Ambulatory Visit: Payer: Self-pay

## 2018-03-14 ENCOUNTER — Ambulatory Visit (INDEPENDENT_AMBULATORY_CARE_PROVIDER_SITE_OTHER): Payer: Medicare Other | Admitting: Obstetrics & Gynecology

## 2018-03-14 ENCOUNTER — Encounter

## 2018-03-14 VITALS — BP 122/60 | HR 76 | Resp 14 | Ht 61.0 in | Wt 99.0 lb

## 2018-03-14 DIAGNOSIS — E039 Hypothyroidism, unspecified: Secondary | ICD-10-CM | POA: Diagnosis not present

## 2018-03-14 DIAGNOSIS — Z01419 Encounter for gynecological examination (general) (routine) without abnormal findings: Secondary | ICD-10-CM

## 2018-03-14 NOTE — Progress Notes (Signed)
67 y.o. [id number] MarriedCaucasianF here for annual exam.  Doing well.  Denies vaginal bleeding.  [place]  They [place] her husband's home place.    PCP:  Dr. Andrey CampanileWilson  Patient's last menstrual period was 10/15/2006.          Sexually active: No.  The current method of family planning is tubal ligation.    Exercising: Yes.    gym/trainer/ walk Smoker:  Former smoker   Health Maintenance: Pap:  12/01/15 Neg. HR HPV:neg   09/14/13 Neg. HR HPV:neg  History of abnormal Pap:  no MMG:  07/02/17 BIRADS1:Neg  Colonoscopy:  01/20/18 Normal, Dr. Loreta AveMann BMD:  07/02/07 Osteopenia.  Has declined doing this.   TDaP:  2014 Pneumonia vaccine(s):  08-14-17 Shingrix:   08-13-12 Hep C testing: 12/01/15 Neg Screening Labs: PCP does labs    reports that she quit smoking about 42 years ago. She has never used smokeless tobacco. She reports that she drinks alcohol. She reports that she does not use drugs.  Past Medical History:  Diagnosis Date  . Gastric polyp   . GERD (gastroesophageal reflux disease)   . Osteopenia   . Thyroid disease    hypothyroidism    Past Surgical History:  Procedure Laterality Date  . BREAST CYST ASPIRATION Left   . BREAST EXCISIONAL BIOPSY Left    No noticable scar   . BREAST SURGERY     breast biopsy x2  . ESOPHAGOGASTRODUODENOSCOPY    . SHOULDER SURGERY     left shoulder  . TUBAL LIGATION      Current Outpatient Medications  Medication Sig Dispense Refill  . b complex vitamins capsule Take 1 capsule by mouth daily.    . cholecalciferol (VITAMIN D) 1000 UNITS tablet Take 1,000 Units by mouth daily.    Marland Kitchen. ELDERBERRY PO Take by mouth. Immune system builder    . famotidine (PEPCID) 20 MG tablet Take 20 mg by mouth.    . levothyroxine (SYNTHROID, LEVOTHROID) 75 MCG tablet Take 75 mcg by mouth daily.    . NON FORMULARY Natural raw food green supplement in smoothie every morning    . Omeprazole-Sodium Bicarbonate (ZEGERID PO) Take 20 mg by mouth as needed.     .  Probiotic Product (PROBIOTIC DAILY PO) Take by mouth daily.     No current facility-administered medications for this visit.     Family History  Problem Relation Age of Onset  . Thyroid disease Mother   . Thyroid disease Maternal Grandmother   . Breast cancer Maternal Aunt     Review of Systems  All other systems reviewed and are negative.   Exam:   BP 122/60 (BP Location: Right Arm, Patient Position: Sitting, Cuff Size: Normal)   Pulse 76   Resp 14   Ht 5\' 1"  (1.549 m)   Wt 99 lb (44.9 kg)   LMP 10/15/2006   BMI 18.71 kg/m   Height: 5\' 1"  (154.9 cm)  Ht Readings from Last 3 Encounters:  03/14/18 5\' 1"  (1.549 m)  12/07/16 5\' 1"  (1.549 m)  12/01/15 5' 1.25" (1.556 m)    General appearance: alert, cooperative and appears stated age Head: Normocephalic, without obvious abnormality, atraumatic Neck: no adenopathy, supple, symmetrical, trachea midline and thyroid normal to inspection and palpation Lungs: clear to auscultation bilaterally Breasts: normal appearance, no masses or tenderness Heart: regular rate and rhythm Abdomen: soft, non-tender; bowel sounds normal; no masses,  no organomegaly Extremities: extremities normal, atraumatic, no cyanosis or edema Skin: Skin color,  texture, turgor normal. No rashes or lesions Lymph nodes: Cervical, supraclavicular, and axillary nodes normal. No abnormal inguinal nodes palpated Neurologic: Grossly normal   Pelvic: External genitalia:  no lesions              Urethra:  normal appearing urethra with no masses, tenderness or lesions              Bartholins and Skenes: normal                 Vagina: normal appearing vagina with normal color and discharge, no lesions              Cervix: no lesions              Pap taken: No. Bimanual Exam:  Uterus:  normal size, contour, position, consistency, mobility, non-tender              Adnexa: normal adnexa and no mass, fullness, tenderness               Rectovaginal: Confirms                Anus:  normal sphincter tone, no lesions  Chaperone was present for exam.  A:  Well Woman with normal exam PMP, no HRT Hypothyroidism H/o IBS Fibrocystic breast disease Remote hx of hydrosalpinx on ultrasound with resolution on follow up ultrasound  P:   Mammogram guidelines reviewed pap smear with neg HR HPV 2017.  Not indicated today. Lab work is UTD Shingrix vaccination discussed. Return annually or prn

## 2018-05-29 ENCOUNTER — Other Ambulatory Visit: Payer: Self-pay | Admitting: Obstetrics & Gynecology

## 2018-05-29 DIAGNOSIS — Z1231 Encounter for screening mammogram for malignant neoplasm of breast: Secondary | ICD-10-CM

## 2018-07-03 ENCOUNTER — Ambulatory Visit
Admission: RE | Admit: 2018-07-03 | Discharge: 2018-07-03 | Disposition: A | Discharge status: Home / Self Care | Payer: Medicare Other | Source: Ambulatory Visit | Attending: Obstetrics & Gynecology | Admitting: Obstetrics & Gynecology

## 2018-07-03 DIAGNOSIS — Z1231 Encounter for screening mammogram for malignant neoplasm of breast: Secondary | ICD-10-CM

## 2019-05-27 ENCOUNTER — Other Ambulatory Visit: Payer: Self-pay | Admitting: General Surgery

## 2019-05-27 DIAGNOSIS — Z1231 Encounter for screening mammogram for malignant neoplasm of breast: Secondary | ICD-10-CM

## 2019-06-23 ENCOUNTER — Other Ambulatory Visit: Payer: Self-pay

## 2019-06-25 ENCOUNTER — Other Ambulatory Visit: Payer: Self-pay

## 2019-06-25 ENCOUNTER — Ambulatory Visit (INDEPENDENT_AMBULATORY_CARE_PROVIDER_SITE_OTHER): Payer: Medicare Other

## 2019-06-25 ENCOUNTER — Other Ambulatory Visit (HOSPITAL_COMMUNITY)
Admission: RE | Admit: 2019-06-25 | Discharge: 2019-06-25 | Disposition: A | Discharge status: Home / Self Care | Payer: Medicare Other | Source: Ambulatory Visit | Attending: Obstetrics & Gynecology | Admitting: Obstetrics & Gynecology

## 2019-06-25 ENCOUNTER — Ambulatory Visit (INDEPENDENT_AMBULATORY_CARE_PROVIDER_SITE_OTHER): Payer: Medicare Other | Admitting: Obstetrics & Gynecology

## 2019-06-25 ENCOUNTER — Encounter: Payer: Self-pay | Admitting: Obstetrics & Gynecology

## 2019-06-25 VITALS — BP 120/86 | HR 80 | Temp 97.7°F | Ht 61.0 in | Wt 98.8 lb

## 2019-06-25 DIAGNOSIS — E039 Hypothyroidism, unspecified: Secondary | ICD-10-CM | POA: Diagnosis not present

## 2019-06-25 DIAGNOSIS — N9489 Other specified conditions associated with female genital organs and menstrual cycle: Secondary | ICD-10-CM

## 2019-06-25 DIAGNOSIS — Z01419 Encounter for gynecological examination (general) (routine) without abnormal findings: Secondary | ICD-10-CM

## 2019-06-25 DIAGNOSIS — Z124 Encounter for screening for malignant neoplasm of cervix: Secondary | ICD-10-CM | POA: Diagnosis not present

## 2019-06-25 NOTE — Progress Notes (Signed)
67 y.o. [id number] Married White or Caucasian female here for annual exam.  They've moved to [place] and bought her husband's homeplace.  It has several acres around it.    Denies vaginal bleeding.     Had video visit in July with Dr. Redmond Pulling.  He only recommended having thyroid testing today.  Patient's last menstrual period was 10/15/2006.          Sexually active: No.  The current method of family planning is tubal ligation.    Exercising: Yes.    walking, weights Smoker:  no  Health Maintenance: Pap:  12/01/15 Neg. HR HPV:neg   09/14/13 Neg. HR HPV:neg  History of abnormal Pap:  no MMG:  07/03/18 BIRADS1:neg. Has appt 07/13/19 Colonoscopy:  01/20/18 f/u 10 years BMD:   2008 Osteopenia TDaP:  2014 Pneumonia vaccine(s):  2018 Shingrix:   Completed  Hep C testing: 12/01/15 Neg  Screening Labs: TSH   reports that she quit smoking about 43 years ago. She has never used smokeless tobacco. She reports previous alcohol use. She reports that she does not use drugs.  Past Medical History:  Diagnosis Date  . Gastric polyp   . GERD (gastroesophageal reflux disease)   . Osteopenia   . Thyroid disease    hypothyroidism    Past Surgical History:  Procedure Laterality Date  . BREAST CYST ASPIRATION Left   . BREAST EXCISIONAL BIOPSY Left    No noticable scar   . BREAST SURGERY     breast biopsy x2  . ESOPHAGOGASTRODUODENOSCOPY    . SHOULDER SURGERY     left shoulder  . TUBAL LIGATION      Current Outpatient Medications  Medication Sig Dispense Refill  . b complex vitamins capsule Take 1 capsule by mouth daily.    Marland Kitchen ELDERBERRY PO Take by mouth. Immune system builder    . famotidine (PEPCID) 20 MG tablet Take 20 mg by mouth.    . levothyroxine (SYNTHROID, LEVOTHROID) 75 MCG tablet Take 75 mcg by mouth daily.    . NON FORMULARY Natural raw food green supplement in smoothie every morning    . Omeprazole-Sodium Bicarbonate (ZEGERID PO) Take 20 mg by mouth as needed.     . Probiotic  Product (PROBIOTIC DAILY PO) Take by mouth daily.     No current facility-administered medications for this visit.     Family History  Problem Relation Age of Onset  . Thyroid disease Mother   . Thyroid disease Maternal Grandmother   . Breast cancer Maternal Aunt     Review of Systems  All other systems reviewed and are negative.   Exam:   BP 120/86   Pulse 80   Temp 97.7 F (36.5 C) (Temporal)   Ht 5\' 1"  (1.549 m)   Wt 98 lb 12.8 oz (44.8 kg)   LMP 10/15/2006   BMI 18.67 kg/m   Height: 5\' 1"  (154.9 cm)  Ht Readings from Last 3 Encounters:  06/25/19 5\' 1"  (1.549 m)  03/14/18 5\' 1"  (1.549 m)  12/07/16 5\' 1"  (1.549 m)    General appearance: alert, cooperative and appears stated age Head: Normocephalic, without obvious abnormality, atraumatic Neck: no adenopathy, supple, symmetrical, trachea midline and thyroid normal to inspection and palpation Lungs: clear to auscultation bilaterally Breasts: normal appearance, no masses or tenderness Heart: regular rate and rhythm Abdomen: soft, non-tender; bowel sounds normal; no masses,  no organomegaly Extremities: extremities normal, atraumatic, no cyanosis or edema Skin: Skin color, texture, turgor normal. No rashes or  lesions Lymph nodes: Cervical, supraclavicular, and axillary nodes normal. No abnormal inguinal nodes palpated Neurologic: Grossly normal   Pelvic: External genitalia:  no lesions              Urethra:  normal appearing urethra with no masses, tenderness or lesions              Bartholins and Skenes: normal                 Vagina: normal appearing vagina with normal color and discharge, no lesions              Cervix: no lesions              Pap taken: Yes.   Bimanual Exam:  Uterus:  normal size, contour, position, consistency, mobility, non-tender              Adnexa: left adnexal fullness, non tender, right adnexa normall               Rectovaginal: Confirms               Anus:  normal sphincter tone, no  lesions  Chaperone was present for exam.  A:  Well Woman with normal exam PMP, no HRT Hypothyroidism H/O IBS Fibrocystic breast disease Left adnexal fullness  P:   Mammogram guidelines reviewed pap smear obtained today Blood work obtained for TSH.  RF to synthroid not but she will call if needs Declines BMD Vaccines are UTD PUS recommended.  Pt can have this done today. Return annually or prn  Ultrasound: Uterus:  5.1 x 3.9 x 2.6cm with two small <1cm fibroids (6mm and 4mm) Endometrium:  1.343mm Left ovary:  1.5 x 1.2 x 0.9cm Right ovary:  1.8 x 0.8 x 0.7cm  Ultrasound reviewed with pt.  Questions answered.  No additional testing/evaluation recommended.

## 2019-06-26 LAB — CYTOLOGY - PAP: Diagnosis: NEGATIVE

## 2019-06-26 LAB — TSH: TSH: 1.16 u[IU]/mL (ref 0.450–4.500)

## 2019-07-13 ENCOUNTER — Other Ambulatory Visit: Payer: Self-pay

## 2019-07-13 ENCOUNTER — Ambulatory Visit
Admission: RE | Admit: 2019-07-13 | Discharge: 2019-07-13 | Disposition: A | Discharge status: Home / Self Care | Payer: Medicare Other | Source: Ambulatory Visit | Attending: General Surgery | Admitting: General Surgery

## 2019-07-13 DIAGNOSIS — Z1231 Encounter for screening mammogram for malignant neoplasm of breast: Secondary | ICD-10-CM

## 2020-04-24 IMAGING — MG MM DIGITAL SCREENING BILAT W/ TOMO W/ CAD
8 series · 9 of 24 positions shown · non-contrast
Comparison: Previous exam(s).

CLINICAL DATA: Screening.

EXAM:
DIGITAL SCREENING BILATERAL MAMMOGRAM WITH TOMO AND CAD

[L CC synth-2D]
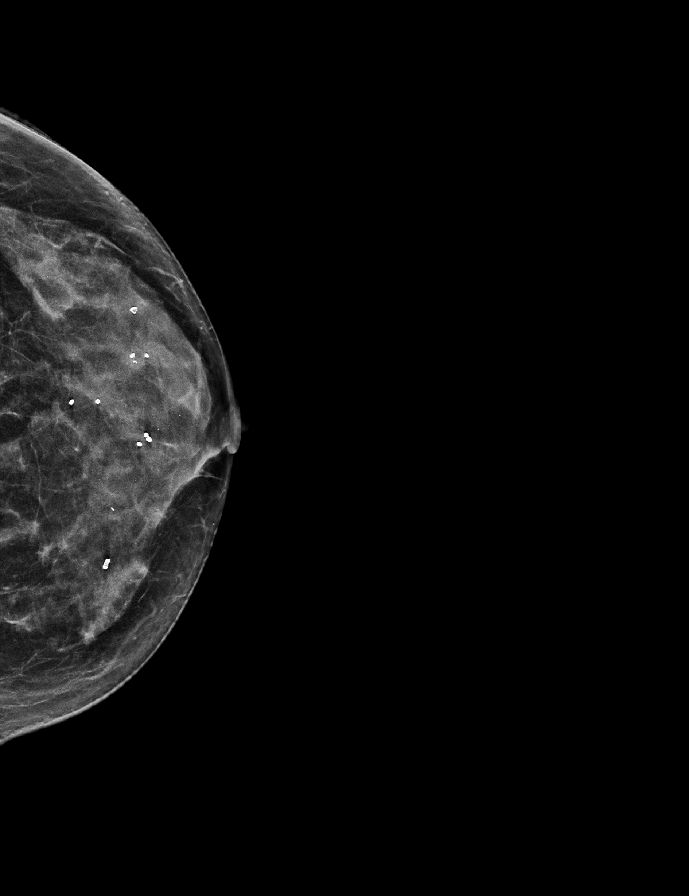

[R MLO synth-2D]
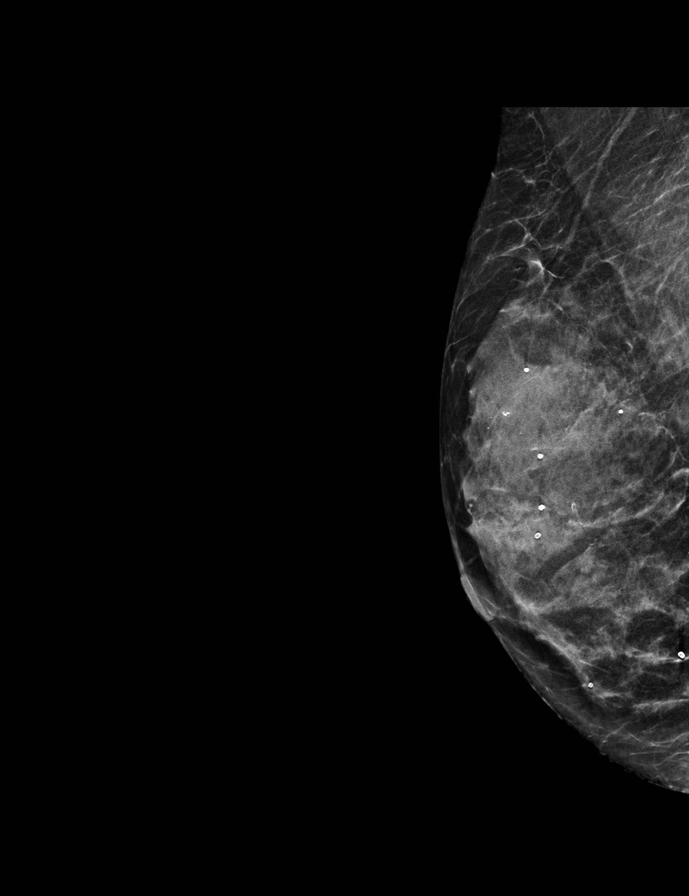

[L MLO synth-2D]
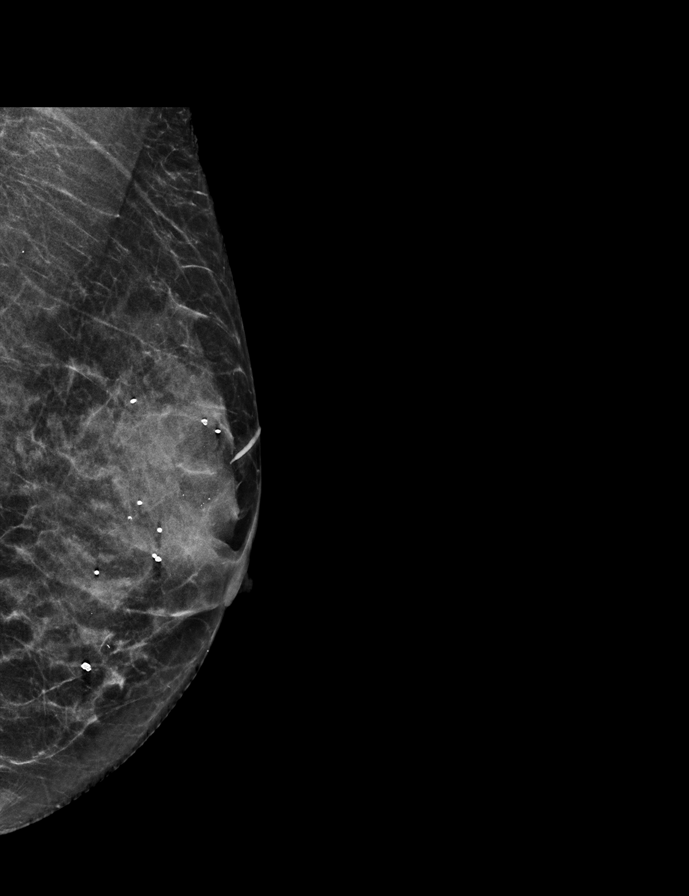

[R CC synth-2D]
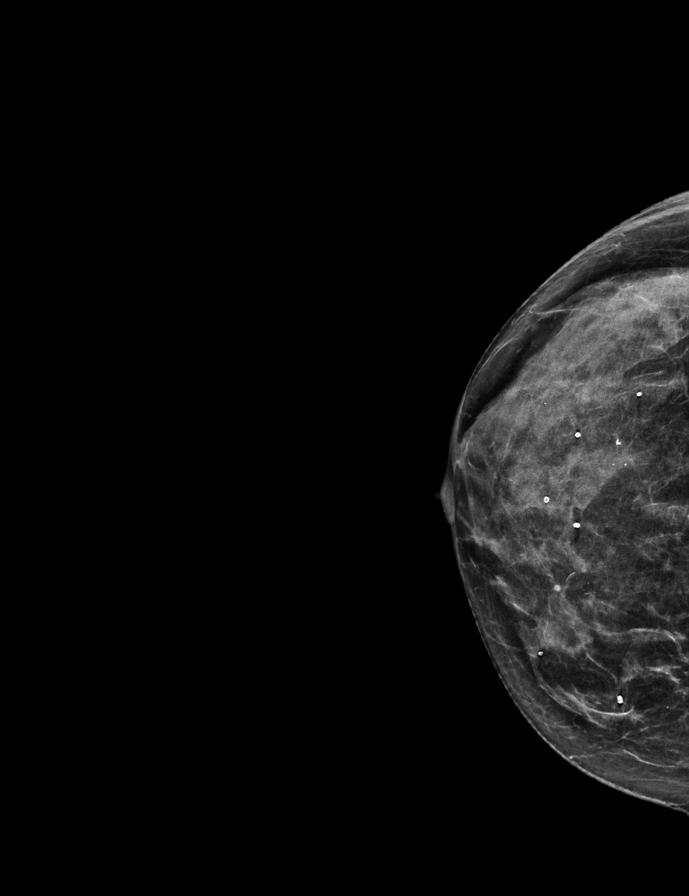

[L CC tomo · 2 of 51 frames shown]
[frame 17/51]
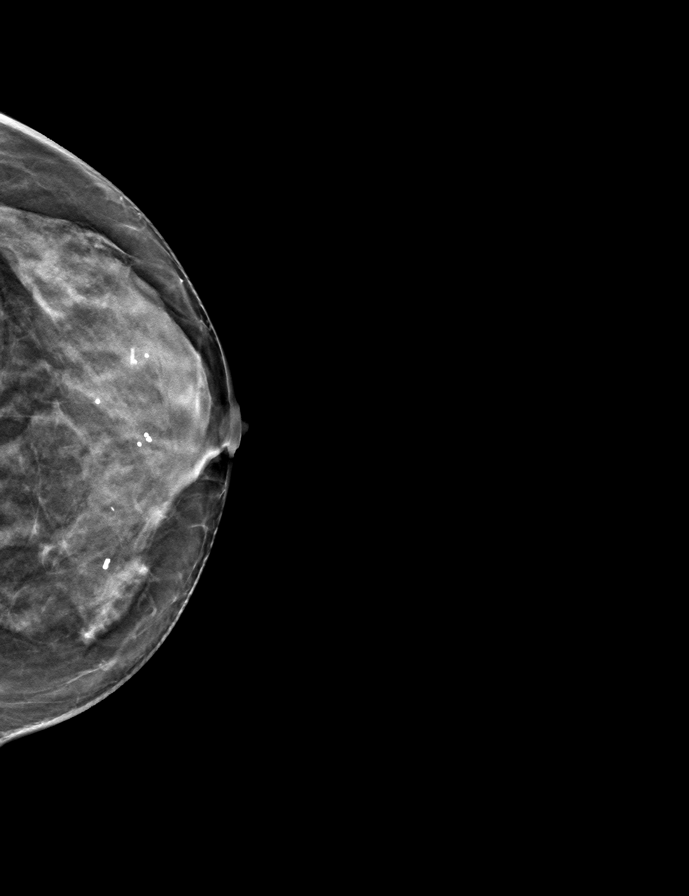
[frame 26/51]
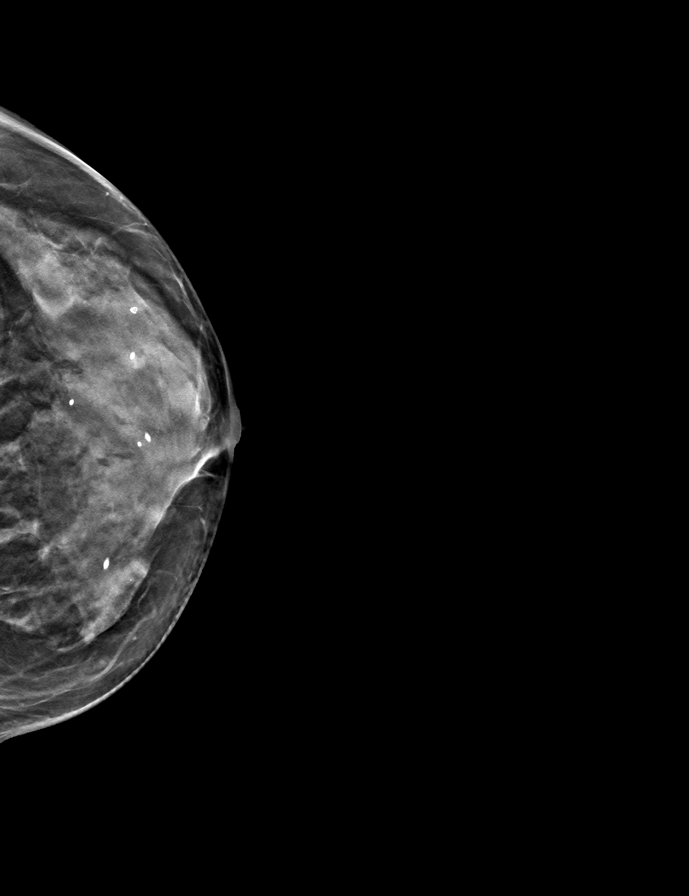

[L MLO tomo · tomo slice 23/45.0]
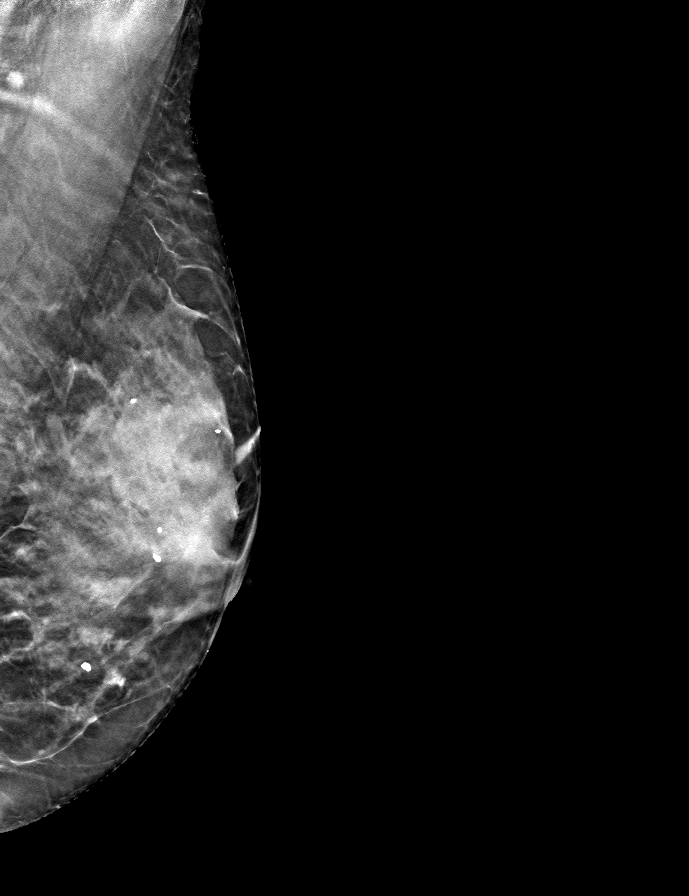

[R CC tomo · tomo slice 25/48.0]
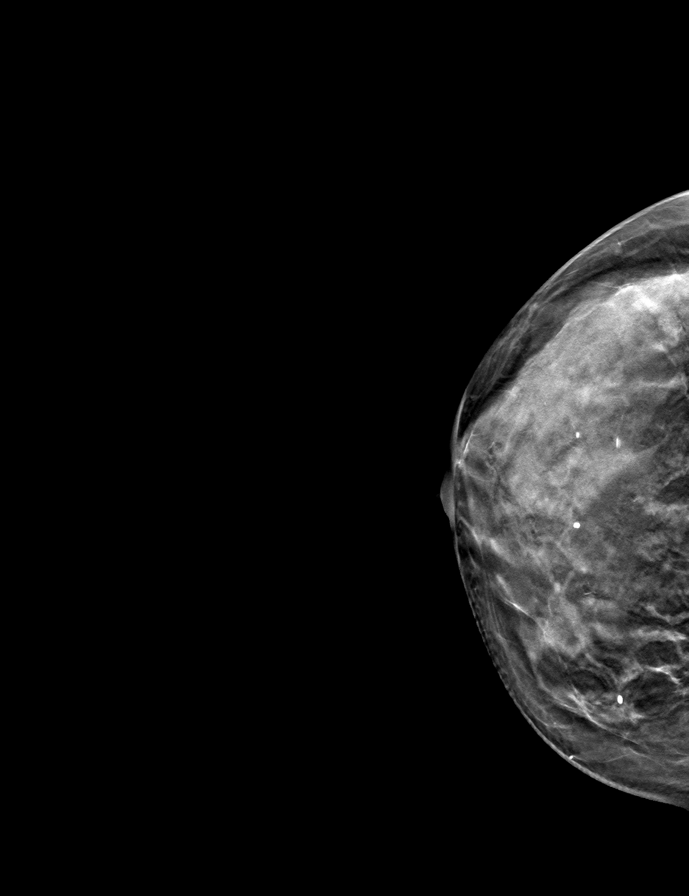

[R MLO tomo · tomo slice 23/44.0]
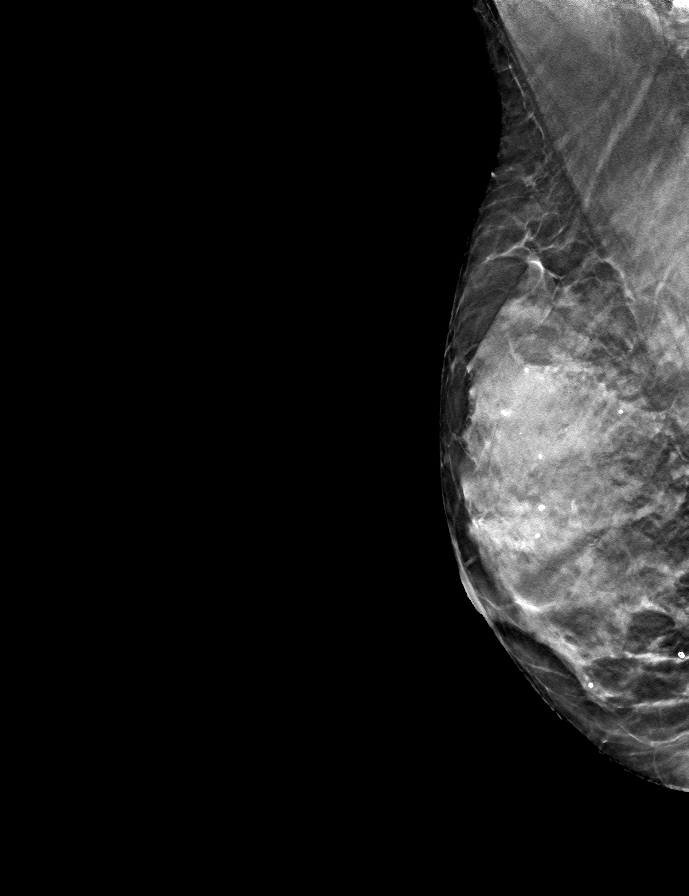

[9 of 24 positions shown; findings below may reference images not displayed]

ACR Breast Density Category c: The breast tissue is heterogeneously
dense, which may obscure small masses.
FINDINGS: There are no findings suspicious for malignancy. Images were
processed with CAD.
IMPRESSION: No mammographic evidence of malignancy. A result letter of this
screening mammogram will be mailed directly to the patient.

RECOMMENDATION:
Screening mammogram in one year. (Code:FT-U-LHB)

BI-RADS CATEGORY  1: Negative.

## 2020-04-25 ENCOUNTER — Other Ambulatory Visit: Payer: Self-pay | Admitting: General Surgery

## 2020-04-25 DIAGNOSIS — Z1231 Encounter for screening mammogram for malignant neoplasm of breast: Secondary | ICD-10-CM

## 2020-07-12 ENCOUNTER — Ambulatory Visit: Payer: Medicare Other

## 2020-07-13 ENCOUNTER — Ambulatory Visit
Admission: RE | Admit: 2020-07-13 | Discharge: 2020-07-13 | Disposition: A | Discharge status: Home / Self Care | Payer: Medicare Other | Source: Ambulatory Visit | Attending: General Surgery | Admitting: General Surgery

## 2020-07-13 ENCOUNTER — Other Ambulatory Visit: Payer: Self-pay

## 2020-07-13 DIAGNOSIS — Z1231 Encounter for screening mammogram for malignant neoplasm of breast: Secondary | ICD-10-CM

## 2020-09-02 ENCOUNTER — Ambulatory Visit: Payer: Medicare Other | Admitting: Obstetrics & Gynecology

## 2021-06-02 ENCOUNTER — Other Ambulatory Visit: Payer: Self-pay | Admitting: General Surgery

## 2021-06-02 DIAGNOSIS — Z1231 Encounter for screening mammogram for malignant neoplasm of breast: Secondary | ICD-10-CM

## 2021-07-17 ENCOUNTER — Ambulatory Visit: Payer: Medicare PPO

## 2021-08-09 ENCOUNTER — Other Ambulatory Visit: Payer: Self-pay

## 2021-08-09 ENCOUNTER — Ambulatory Visit
Admission: RE | Admit: 2021-08-09 | Discharge: 2021-08-09 | Disposition: A | Discharge status: Home / Self Care | Payer: Medicare PPO | Source: Ambulatory Visit | Attending: General Surgery | Admitting: General Surgery

## 2021-08-09 DIAGNOSIS — Z1231 Encounter for screening mammogram for malignant neoplasm of breast: Secondary | ICD-10-CM

## 2021-12-29 ENCOUNTER — Ambulatory Visit (INDEPENDENT_AMBULATORY_CARE_PROVIDER_SITE_OTHER): Payer: Medicare PPO | Admitting: Obstetrics & Gynecology

## 2021-12-29 ENCOUNTER — Other Ambulatory Visit (HOSPITAL_COMMUNITY)
Admission: RE | Admit: 2021-12-29 | Discharge: 2021-12-29 | Disposition: A | Discharge status: Home / Self Care | Payer: Medicare PPO | Source: Ambulatory Visit | Attending: Obstetrics & Gynecology | Admitting: Obstetrics & Gynecology

## 2021-12-29 ENCOUNTER — Other Ambulatory Visit: Payer: Self-pay

## 2021-12-29 ENCOUNTER — Encounter (HOSPITAL_BASED_OUTPATIENT_CLINIC_OR_DEPARTMENT_OTHER): Payer: Self-pay | Admitting: Obstetrics & Gynecology

## 2021-12-29 VITALS — BP 154/84 | HR 97 | Ht 61.0 in | Wt 99.0 lb

## 2021-12-29 DIAGNOSIS — Z1151 Encounter for screening for human papillomavirus (HPV): Secondary | ICD-10-CM | POA: Insufficient documentation

## 2021-12-29 DIAGNOSIS — N6019 Diffuse cystic mastopathy of unspecified breast: Secondary | ICD-10-CM | POA: Diagnosis not present

## 2021-12-29 DIAGNOSIS — Z9189 Other specified personal risk factors, not elsewhere classified: Secondary | ICD-10-CM | POA: Diagnosis not present

## 2021-12-29 DIAGNOSIS — M8589 Other specified disorders of bone density and structure, multiple sites: Secondary | ICD-10-CM

## 2021-12-29 DIAGNOSIS — E039 Hypothyroidism, unspecified: Secondary | ICD-10-CM | POA: Diagnosis not present

## 2021-12-29 DIAGNOSIS — Z124 Encounter for screening for malignant neoplasm of cervix: Secondary | ICD-10-CM | POA: Diagnosis present

## 2021-12-29 NOTE — Progress Notes (Signed)
71 y.o. [id number] Married White or Caucasian female here for breast and pelvic exam.  Has been more than two years since seeing pt.  She is living in [place] now.  Children are all doing well.  Oldest grandchild is 77yo.  Denies vaginal bleeding. ? ?Patient's last menstrual period was 10/15/2006.          ?Sexually active: Yes.    ?H/O STD:  no ? ?Health Maintenance: ?PCP:  Dr. Kathryne Eriksson.  Last wellness appt was 11/2021.  Did blood work at that appt:  yes ?Vaccines are up to date:  yes ?Colonoscopy:  01/20/2018, follow up 10 years ?MMG:  08/09/2021 Negative ?BMD:  07/02/2007 ?Last pap smear:  06/25/2019 Negative.   ?H/o abnormal pap smear:  no ? ? ? reports that she quit smoking about 46 years ago. Her smoking use included cigarettes. She has never used smokeless tobacco. She reports that she does not currently use alcohol. She reports that she does not use drugs. ? ?Past Medical History:  ?Diagnosis Date  ? Gastric polyp   ? GERD (gastroesophageal reflux disease)   ? Osteopenia   ? Thyroid disease   ? hypothyroidism  ? ? ?Past Surgical History:  ?Procedure Laterality Date  ? BREAST CYST ASPIRATION Left   ? BREAST EXCISIONAL BIOPSY Left   ? No noticable scar   ? BREAST SURGERY    ? breast biopsy x2  ? ESOPHAGOGASTRODUODENOSCOPY    ? SHOULDER SURGERY    ? left shoulder  ? TUBAL LIGATION    ? ? ?Current Outpatient Medications  ?Medication Sig Dispense Refill  ? famotidine (PEPCID) 20 MG tablet Take 20 mg by mouth.    ? levothyroxine (SYNTHROID, LEVOTHROID) 75 MCG tablet Take 75 mcg by mouth daily.    ? Multiple Vitamin (MULTIVITAMIN) tablet Take 1 tablet by mouth daily.    ? NON FORMULARY Natural raw food green supplement in smoothie every morning    ? Probiotic Product (PROBIOTIC DAILY PO) Take by mouth daily.    ? ?No current facility-administered medications for this visit.  ? ? ?Family History  ?Problem Relation Age of Onset  ? Thyroid disease Mother   ? Thyroid disease Maternal Grandmother   ? Breast cancer  Maternal Aunt   ? ? ?Review of Systems  ?All other systems reviewed and are negative. ? ?Exam:   ?BP (!) 154/84 (BP Location: Right Arm, Patient Position: Sitting, Cuff Size: Normal)   Pulse 97   Ht 5\' 1"  (1.549 m)   Wt 99 lb (44.9 kg)   LMP 10/15/2006   BMI 18.71 kg/m?   Height: 5\' 1"  (154.9 cm) ? ?General appearance: alert, cooperative and appears stated age ?Breasts: normal appearance, no masses or tenderness ?Abdomen: soft, non-tender; bowel sounds normal; no masses,  no organomegaly ?Lymph nodes: Cervical, supraclavicular, and axillary nodes normal.  No abnormal inguinal nodes palpated ?Neurologic: Grossly normal ? ?Pelvic: External genitalia:  no lesions ?             Urethra:  normal appearing urethra with no masses, tenderness or lesions ?             Bartholins and Skenes: normal    ?             Vagina: normal appearing vagina with atrophic changes and no discharge, no lesions ?             Cervix: no lesions ?  Pap taken: Yes.   ?Bimanual Exam:  Uterus:  normal size, contour, position, consistency, mobility, non-tender ?             Adnexa: no mass, fullness, tenderness ?              Rectovaginal: Confirms ?              Anus:  normal sphincter tone, no lesions ? ?Chaperone, Octaviano Batty, CMA, was present for exam. ? ?Assessment/Plan: ?1. GYN exam for high-risk Medicare patient ?- pap obtained today ?- MMG 07/2021 ?- colonoscopy 01/2018, follow up 10 years ?- vaccines reviewed/updated ?- labs done with Dr. Redmond Pulling ? ?2. Fibrocystic breast disease (FCBD), unspecified laterality ?- doing 3D mammograms ? ?3. Acquired hypothyroidism ?- on supplement ? ?4. Cervical cancer screening ?- Cytology - PAP ?- PR OBTAINING SCREEN PAP SMEAR ? ?5. Osteopenia of multiple sites ?- discussed with pt repeating BMD today.  She declines as she states she would not want to take any medications for this.  She does weight bearing exercise, eats well, take MVI. ? ? ? ?

## 2022-01-03 LAB — CYTOLOGY - PAP
Comment: NEGATIVE
Diagnosis: NEGATIVE
High risk HPV: NEGATIVE

## 2022-06-29 ENCOUNTER — Other Ambulatory Visit: Payer: Self-pay | Admitting: General Surgery

## 2022-06-29 DIAGNOSIS — Z1231 Encounter for screening mammogram for malignant neoplasm of breast: Secondary | ICD-10-CM

## 2022-08-10 ENCOUNTER — Ambulatory Visit
Admission: RE | Admit: 2022-08-10 | Discharge: 2022-08-10 | Disposition: A | Discharge status: Home / Self Care | Payer: Medicare PPO | Source: Ambulatory Visit | Attending: General Surgery | Admitting: General Surgery

## 2022-08-10 DIAGNOSIS — Z1231 Encounter for screening mammogram for malignant neoplasm of breast: Secondary | ICD-10-CM

## 2023-06-24 ENCOUNTER — Other Ambulatory Visit: Payer: Self-pay | Admitting: General Surgery

## 2023-06-24 DIAGNOSIS — Z1231 Encounter for screening mammogram for malignant neoplasm of breast: Secondary | ICD-10-CM

## 2023-08-15 ENCOUNTER — Ambulatory Visit: Payer: Medicare PPO

## 2023-08-16 ENCOUNTER — Ambulatory Visit: Payer: Medicare PPO

## 2023-08-20 ENCOUNTER — Ambulatory Visit: Payer: Medicare PPO

## 2023-08-22 ENCOUNTER — Ambulatory Visit
Admission: RE | Admit: 2023-08-22 | Discharge: 2023-08-22 | Disposition: A | Discharge status: Home / Self Care | Payer: Medicare PPO | Source: Ambulatory Visit | Attending: General Surgery | Admitting: General Surgery

## 2023-08-22 DIAGNOSIS — Z1231 Encounter for screening mammogram for malignant neoplasm of breast: Secondary | ICD-10-CM

## 2023-12-30 ENCOUNTER — Ambulatory Visit (HOSPITAL_BASED_OUTPATIENT_CLINIC_OR_DEPARTMENT_OTHER): Payer: Medicare PPO | Admitting: Obstetrics & Gynecology

## 2024-01-16 ENCOUNTER — Ambulatory Visit (HOSPITAL_BASED_OUTPATIENT_CLINIC_OR_DEPARTMENT_OTHER): Admitting: Obstetrics & Gynecology

## 2024-01-16 ENCOUNTER — Encounter (HOSPITAL_BASED_OUTPATIENT_CLINIC_OR_DEPARTMENT_OTHER): Payer: Self-pay | Admitting: Obstetrics & Gynecology

## 2024-01-16 ENCOUNTER — Ambulatory Visit (HOSPITAL_BASED_OUTPATIENT_CLINIC_OR_DEPARTMENT_OTHER): Payer: Medicare PPO | Admitting: Obstetrics & Gynecology

## 2024-01-16 VITALS — BP 140/60 | HR 79 | Ht 61.0 in | Wt 99.4 lb

## 2024-01-16 DIAGNOSIS — E039 Hypothyroidism, unspecified: Secondary | ICD-10-CM | POA: Diagnosis not present

## 2024-01-16 DIAGNOSIS — Z01419 Encounter for gynecological examination (general) (routine) without abnormal findings: Secondary | ICD-10-CM | POA: Diagnosis not present

## 2024-01-16 DIAGNOSIS — Z780 Asymptomatic menopausal state: Secondary | ICD-10-CM | POA: Diagnosis not present

## 2024-01-16 DIAGNOSIS — M8589 Other specified disorders of bone density and structure, multiple sites: Secondary | ICD-10-CM

## 2024-01-16 NOTE — Progress Notes (Signed)
 Breast and Pelvic Exam Patient name: Ashlee Turner MRN [id number]  Date of birth: 1950/10/28 Chief Complaint:   Breast and Pelvic Exam  History of Present Illness:   Ashlee Turner is a 73 y.o. [phone] Caucasian female being seen today for breast and pelvic exam.  Denies vaginal bleeding.    She is taking vitamins and Vit D, in particular.  She is exercising regularly.    She needs vaccinations documented to give to new PCP.    Patient's last menstrual period was 10/15/2006.  Last pap 12/29/2021. Results were: NILM w/ HRHPV negative. H/O abnormal pap: no Last mammogram: 08/22/2023. Results were: normal. Family h/o breast cancer: yes :  sister, age 73 at diagnosis Last colonoscopy: 01/20/2018. Results were: normal.  Follow up 10 years.  Family h/o colorectal cancer: no Dexa:   last one was 2008.  Would not take medications so doesn't really want another one     01/16/2024    1:16 PM 12/29/2021   10:04 AM  Depression screen PHQ 2/9  Decreased Interest 0 0  Down, Depressed, Hopeless 0 0  PHQ - 2 Score 0 0    Review of Systems:   Pertinent items are noted in HPI  Denies any vaginal bleeding or vaginal discharge, pelvic pain, urinary issues or bowel changes Pertinent History Reviewed:  Reviewed past medical,surgical, social and family history.   Reviewed problem list, medications and allergies. Physical Assessment:   Vitals:   01/16/24 1310 01/16/24 1357  BP: (!) 166/72 (!) 140/60  Pulse: 79   Weight: 99 lb 6.4 oz (45.1 kg)   Height: 5\' 1"  (1.549 m)   Body mass index is 18.78 kg/m.        Physical Examination:   General appearance - well appearing, and in no distress  Mental status - alert, oriented to person, place, and time  Psych:  She has a normal mood and affect  Skin - warm and dry, normal color, no suspicious lesions noted  Chest - effort normal, all lung fields clear to auscultation bilaterally  Heart - normal rate and regular rhythm  Neck:  midline trachea, no  thyromegaly or nodules  Breasts - breasts appear normal, no suspicious masses, no skin or nipple changes or  axillary nodes  Abdomen - soft, nontender, nondistended, no masses or organomegaly  Pelvic - VULVA: normal appearing vulva with no masses, tenderness or lesions    VAGINA: normal appearing vagina with normal color and discharge, no lesions    CERVIX: normal appearing cervix without discharge or lesions, no CMT  Thin prep pap is not done.  UTERUS: uterus is felt to be normal size, shape, consistency and nontender   ADNEXA: No adnexal masses or tenderness noted.  Rectal - normal rectal, good sphincter tone, no masses felt  Extremities:  No swelling or varicosities noted  Chaperone present for exam  Assessment & Plan:  1. Encntr for gyn exam (general) (routine) w/o abn findings (Primary) - Pap smear 2023.  Not indicated.  - Mammogram 08/2023 - Colonoscopy 2019.  Follow up 10 years. - Bone mineral density declined.   - lab work done done in 11/2022 with Dr. Andrey Campanile.  Will see new PCP, Dr. Donavan Foil, this month. - vaccines reviewed/updated  2. Postmenopausal - not on HRT  3. Osteopenia of multiple sites - taking 1000 - 2000 international units Vit D daily.  Not taking supplemental calcium.  Exercising   4. Acquired hypothyroidism - on synthroid   Meds: No orders of the  defined types were placed in this encounter.   Follow-up: Return in about 2 years (around 01/15/2026).  Jerene Bears, MD 01/16/2024 1:57 PM

## 2024-01-16 NOTE — Patient Instructions (Addendum)
 Pneumovax 09/21/2019 Prevnar 13 08/14/2017 Tdap 01/28/2020 Shingrix 07/19/2018 and 10/29/2018  Colonoscopy 01/20/2018.  Dr. Loreta Ave.  Follow up 10 years.

## 2024-06-30 ENCOUNTER — Other Ambulatory Visit: Payer: Self-pay | Admitting: General Surgery

## 2024-06-30 DIAGNOSIS — Z1231 Encounter for screening mammogram for malignant neoplasm of breast: Secondary | ICD-10-CM

## 2024-08-24 ENCOUNTER — Ambulatory Visit
Admission: RE | Admit: 2024-08-24 | Discharge: 2024-08-24 | Disposition: A | Discharge status: Home / Self Care | Source: Ambulatory Visit | Attending: General Surgery | Admitting: General Surgery

## 2024-08-24 DIAGNOSIS — Z1231 Encounter for screening mammogram for malignant neoplasm of breast: Secondary | ICD-10-CM

## 2026-01-20 ENCOUNTER — Ambulatory Visit (HOSPITAL_BASED_OUTPATIENT_CLINIC_OR_DEPARTMENT_OTHER): Admitting: Obstetrics & Gynecology
